# Patient Record
Sex: Female | Born: 1970 | Race: White | Hispanic: No | Smoking: Former smoker
Health system: Southern US, Community
[De-identification: ages and names within clinical notes are randomized; demographics above are authoritative.]

## PROBLEM LIST (undated history)

## (undated) DIAGNOSIS — Z86711 Personal history of pulmonary embolism: Secondary | ICD-10-CM

## (undated) DIAGNOSIS — Z8701 Personal history of pneumonia (recurrent): Secondary | ICD-10-CM

## (undated) DIAGNOSIS — I10 Essential (primary) hypertension: Secondary | ICD-10-CM

## (undated) DIAGNOSIS — K5010 Crohn's disease of large intestine without complications: Secondary | ICD-10-CM

## (undated) DIAGNOSIS — J189 Pneumonia, unspecified organism: Secondary | ICD-10-CM

## (undated) DIAGNOSIS — K219 Gastro-esophageal reflux disease without esophagitis: Secondary | ICD-10-CM

## (undated) DIAGNOSIS — G4730 Sleep apnea, unspecified: Secondary | ICD-10-CM

## (undated) DIAGNOSIS — F32A Depression, unspecified: Secondary | ICD-10-CM

## (undated) DIAGNOSIS — T4145XA Adverse effect of unspecified anesthetic, initial encounter: Secondary | ICD-10-CM

## (undated) DIAGNOSIS — K5090 Crohn's disease, unspecified, without complications: Secondary | ICD-10-CM

## (undated) DIAGNOSIS — F419 Anxiety disorder, unspecified: Secondary | ICD-10-CM

## (undated) DIAGNOSIS — J45909 Unspecified asthma, uncomplicated: Secondary | ICD-10-CM

## (undated) DIAGNOSIS — F329 Major depressive disorder, single episode, unspecified: Secondary | ICD-10-CM

## (undated) HISTORY — DX: Anxiety disorder, unspecified: F41.9

## (undated) HISTORY — DX: Essential (primary) hypertension: I10

## (undated) HISTORY — DX: Crohn's disease of large intestine without complications: K50.10

## (undated) HISTORY — DX: Personal history of pneumonia (recurrent): Z87.01

## (undated) HISTORY — PX: TUBAL LIGATION: SHX77

## (undated) HISTORY — PX: FRACTURE SURGERY: SHX138

## (undated) HISTORY — PX: ABDOMINAL HYSTERECTOMY: SHX81

## (undated) HISTORY — PX: SEPTOPLASTY: SUR1290

## (undated) HISTORY — PX: VAGINAL HYSTERECTOMY: SUR661

---

## 2004-12-28 ENCOUNTER — Emergency Department (HOSPITAL_COMMUNITY): Admission: EM | Admit: 2004-12-28 | Discharge: 2004-12-28 | Payer: Self-pay | Admitting: Emergency Medicine

## 2005-07-09 ENCOUNTER — Emergency Department (HOSPITAL_COMMUNITY): Admission: EM | Admit: 2005-07-09 | Discharge: 2005-07-09 | Payer: Self-pay | Admitting: Family Medicine

## 2006-09-11 ENCOUNTER — Emergency Department (HOSPITAL_COMMUNITY): Admission: EM | Admit: 2006-09-11 | Discharge: 2006-09-11 | Payer: Self-pay | Admitting: Emergency Medicine

## 2006-10-21 ENCOUNTER — Encounter (HOSPITAL_COMMUNITY): Admission: RE | Admit: 2006-10-21 | Discharge: 2006-11-20 | Payer: Self-pay | Admitting: Orthopedic Surgery

## 2006-11-27 ENCOUNTER — Encounter (HOSPITAL_COMMUNITY): Admission: RE | Admit: 2006-11-27 | Discharge: 2006-12-27 | Payer: Self-pay | Admitting: Orthopedic Surgery

## 2008-07-23 ENCOUNTER — Emergency Department (HOSPITAL_COMMUNITY): Admission: EM | Admit: 2008-07-23 | Discharge: 2008-07-23 | Payer: Self-pay | Admitting: Emergency Medicine

## 2010-10-14 LAB — URINALYSIS, ROUTINE W REFLEX MICROSCOPIC
Leukocytes, UA: NEGATIVE
Urobilinogen, UA: 0.2 mg/dL (ref 0.0–1.0)
pH: 5.5 (ref 5.0–8.0)

## 2010-10-14 LAB — URINE MICROSCOPIC-ADD ON

## 2010-10-14 LAB — PREGNANCY, URINE: Preg Test, Ur: NEGATIVE

## 2010-11-15 NOTE — Consult Note (Signed)
NAMEJASMAIN, Catherine Costa                ACCOUNT NO.:  [id number]   MEDICAL RECORD NO.:  [id number]          PATIENT TYPE:  EMS   LOCATION:  ED                            FACILITY:  [facility]   PHYSICIAN:  J. Darreld Mclean, M.D. DATE OF BIRTH:  1971-06-17   DATE OF CONSULTATION:  DATE OF DISCHARGE:                                 CONSULTATION   REASON FOR CONSULTATION:  The patient fell down some steps and injured  her left ankle.  X-rays show a fracture of the lateral malleolus and  posterior malleolus, essentially nondisplaced.  No other injuries.  The  ankle mortis is in good position and alignment.  There is no head  trauma.  It is a closed injury.  She has swelling and tenderness of the  left ankle and some early ecchymosis.  Range of motion is decreased.   IMPRESSION:  Fracture of the left lateral malleolus and left posterior  malleolus, nondisplaced, closed.   PLAN:  I have put her in a posterior splint.  Recommended crutches, ice,  elevation.  Prescription of Tylox given for pain.  Patient information  booklet given.  I will see her in the office on Thursday.  I will be out  of town Monday, Tuesday and Wednesday.  Dr. Romeo Apple will be covering.  If she has any difficulty come back to the emergency room or contact his  office next week.  I will see her on Thursday.  She should have no  weightbearing, as stated.  Use ice and elevation.           ______________________________  Shela Commons. Darreld Mclean, M.D.     JWK/MEDQ  D:  09/11/2006  T:  09/13/2006  Job:  [id number]

## 2012-02-05 ENCOUNTER — Emergency Department (HOSPITAL_COMMUNITY)
Admission: EM | Admit: 2012-02-05 | Discharge: 2012-02-06 | Disposition: A | Discharge status: Other Acute Inpatient Hospital | Payer: Medicaid Other | Attending: Emergency Medicine | Admitting: Emergency Medicine

## 2012-02-05 ENCOUNTER — Emergency Department (HOSPITAL_COMMUNITY): Payer: Medicaid Other

## 2012-02-05 ENCOUNTER — Encounter (HOSPITAL_COMMUNITY): Payer: Self-pay | Admitting: *Deleted

## 2012-02-05 DIAGNOSIS — R059 Cough, unspecified: Secondary | ICD-10-CM | POA: Insufficient documentation

## 2012-02-05 DIAGNOSIS — F329 Major depressive disorder, single episode, unspecified: Secondary | ICD-10-CM | POA: Insufficient documentation

## 2012-02-05 DIAGNOSIS — J189 Pneumonia, unspecified organism: Secondary | ICD-10-CM

## 2012-02-05 DIAGNOSIS — F3289 Other specified depressive episodes: Secondary | ICD-10-CM | POA: Insufficient documentation

## 2012-02-05 DIAGNOSIS — F32A Depression, unspecified: Secondary | ICD-10-CM

## 2012-02-05 DIAGNOSIS — R45851 Suicidal ideations: Secondary | ICD-10-CM

## 2012-02-05 DIAGNOSIS — F17200 Nicotine dependence, unspecified, uncomplicated: Secondary | ICD-10-CM | POA: Insufficient documentation

## 2012-02-05 DIAGNOSIS — R05 Cough: Secondary | ICD-10-CM | POA: Insufficient documentation

## 2012-02-05 LAB — CBC WITH DIFFERENTIAL/PLATELET
Basophils Absolute: 0 10*3/uL (ref 0.0–0.1)
Eosinophils Absolute: 0 10*3/uL (ref 0.0–0.7)
Hemoglobin: 14.2 g/dL (ref 12.0–15.0)
MCH: 31.2 pg (ref 26.0–34.0)
MCHC: 34.5 g/dL (ref 30.0–36.0)
MCV: 90.3 fL (ref 78.0–100.0)
Monocytes Absolute: 0.4 10*3/uL (ref 0.1–1.0)
Neutrophils Relative %: 68 % (ref 43–77)
Platelets: 160 10*3/uL (ref 150–400)
RDW: 12.9 % (ref 11.5–15.5)
WBC: 4.5 10*3/uL (ref 4.0–10.5)

## 2012-02-05 LAB — COMPREHENSIVE METABOLIC PANEL
ALT: 10 U/L (ref 0–35)
Alkaline Phosphatase: 74 U/L (ref 39–117)
BUN: 8 mg/dL (ref 6–23)
CO2: 25 mEq/L (ref 19–32)
Creatinine, Ser: 0.82 mg/dL (ref 0.50–1.10)
Sodium: 135 mEq/L (ref 135–145)
Total Bilirubin: 0.3 mg/dL (ref 0.3–1.2)

## 2012-02-05 LAB — RAPID URINE DRUG SCREEN, HOSP PERFORMED
Amphetamines: NOT DETECTED
Barbiturates: NOT DETECTED
Benzodiazepines: NOT DETECTED
Cocaine: NOT DETECTED
Opiates: NOT DETECTED
Tetrahydrocannabinol: POSITIVE — AB

## 2012-02-05 LAB — URINALYSIS, ROUTINE W REFLEX MICROSCOPIC
Bilirubin Urine: NEGATIVE
Nitrite: NEGATIVE
Urobilinogen, UA: 0.2 mg/dL (ref 0.0–1.0)
pH: 5.5 (ref 5.0–8.0)

## 2012-02-05 LAB — URINE MICROSCOPIC-ADD ON

## 2012-02-05 MED ORDER — LORAZEPAM 1 MG PO TABS
1.0000 mg | ORAL_TABLET | Freq: Three times a day (TID) | ORAL | Status: DC | PRN
  Administered 2012-02-06: 1 mg via ORAL
  Filled 2012-02-05: qty 1

## 2012-02-05 MED ORDER — GUAIFENESIN-CODEINE 100-10 MG/5ML PO SOLN
5.0000 mL | Freq: Once | ORAL | Status: AC
  Administered 2012-02-05: 5 mL via ORAL
  Filled 2012-02-05: qty 5

## 2012-02-05 MED ORDER — NICOTINE 21 MG/24HR TD PT24
21.0000 mg | MEDICATED_PATCH | Freq: Every day | TRANSDERMAL | Status: DC
  Administered 2012-02-05 – 2012-02-06 (×2): 21 mg via TRANSDERMAL
  Filled 2012-02-05: qty 1

## 2012-02-05 MED ORDER — CITALOPRAM HYDROBROMIDE 20 MG PO TABS
ORAL_TABLET | ORAL | Status: AC
  Filled 2012-02-05: qty 1

## 2012-02-05 MED ORDER — ZOLPIDEM TARTRATE 5 MG PO TABS
5.0000 mg | ORAL_TABLET | Freq: Every evening | ORAL | Status: DC | PRN
  Administered 2012-02-06: 5 mg via ORAL
  Filled 2012-02-05: qty 1

## 2012-02-05 MED ORDER — IPRATROPIUM BROMIDE 0.02 % IN SOLN
0.5000 mg | Freq: Once | RESPIRATORY_TRACT | Status: AC
  Administered 2012-02-05: 0.5 mg via RESPIRATORY_TRACT
  Filled 2012-02-05: qty 2.5

## 2012-02-05 MED ORDER — CITALOPRAM HYDROBROMIDE 20 MG PO TABS
20.0000 mg | ORAL_TABLET | Freq: Every day | ORAL | Status: DC
  Administered 2012-02-05: 20 mg via ORAL
  Filled 2012-02-05 (×3): qty 1

## 2012-02-05 MED ORDER — NICOTINE 21 MG/24HR TD PT24
MEDICATED_PATCH | TRANSDERMAL | Status: AC
  Filled 2012-02-05: qty 1

## 2012-02-05 MED ORDER — ACETAMINOPHEN 325 MG PO TABS
650.0000 mg | ORAL_TABLET | ORAL | Status: DC | PRN
  Administered 2012-02-06: 650 mg via ORAL
  Filled 2012-02-05: qty 2

## 2012-02-05 MED ORDER — ALBUTEROL SULFATE (5 MG/ML) 0.5% IN NEBU
5.0000 mg | INHALATION_SOLUTION | Freq: Once | RESPIRATORY_TRACT | Status: AC
  Administered 2012-02-05: 5 mg via RESPIRATORY_TRACT
  Filled 2012-02-05: qty 1

## 2012-02-05 MED ORDER — ONDANSETRON HCL 4 MG PO TABS: 4.0000 mg | ORAL_TABLET | Freq: Three times a day (TID) | ORAL | Status: DC | PRN

## 2012-02-05 MED ORDER — LEVOFLOXACIN 500 MG PO TABS
750.0000 mg | ORAL_TABLET | Freq: Every day | ORAL | Status: DC
  Administered 2012-02-05 – 2012-02-06 (×2): 750 mg via ORAL
  Filled 2012-02-05 (×2): qty 1

## 2012-02-05 MED ORDER — ALBUTEROL SULFATE HFA 108 (90 BASE) MCG/ACT IN AERS
2.0000 | INHALATION_SPRAY | Freq: Four times a day (QID) | RESPIRATORY_TRACT | Status: DC | PRN
  Administered 2012-02-05 – 2012-02-06 (×2): 2 via RESPIRATORY_TRACT
  Filled 2012-02-05: qty 6.7

## 2012-02-05 MED ORDER — PANTOPRAZOLE SODIUM 40 MG PO TBEC
40.0000 mg | DELAYED_RELEASE_TABLET | Freq: Every day | ORAL | Status: DC
  Administered 2012-02-06: 40 mg via ORAL
  Filled 2012-02-05: qty 1

## 2012-02-05 MED ORDER — ACETAMINOPHEN 325 MG PO TABS
650.0000 mg | ORAL_TABLET | Freq: Once | ORAL | Status: AC
  Administered 2012-02-05: 650 mg via ORAL
  Filled 2012-02-05: qty 2

## 2012-02-05 NOTE — BH Assessment (Signed)
Assessment Note   Catherine Costa is an 41 y.o. female. PR REPORTED TO THE ED STATING SHE DOES NOT WANT TO LIVE.  OPT REPORTS FOR THE PAST 2-3 DAYS SHE HAS FELT SUICIDAL . SHE HAD BEE LIVING IN THE HOME WITH HER DAUGHTER AND HER 14 MO OLD DAUGHTER AND HER DAUGHTER'S BOYFRIEND WHO HAS SEVERAL GUNS IN THE HOME. 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  PT ALSO REPORTS RECENT SEPARATION FROM HER HUSBAND WHO WENT TO JAIL FOR DRIVING HER CAR WITHOUT A LICENCE AND WRECKING IT AND NON-SUPPORT. SHE REPORTS HAVING A 60 YEAR OLD SON WHO IS ON DRUGS AND SHE IS UNABLE TO HANDLE HIM. HER 38 YEAR OLD DAUGHTER HAS THYROID CANCER AND REFUSES TO SEE HER.  PT REPORTS SHE HAS A KNOT IN HER BREST AND CAN'T AFFORD TO HAVE IT CHECKED OUT. SHE REPORTS NOT BEING ABLE TO TAKE IT ANYMORE AND JUST WANTS TO DIE. PT REPORTS RECENT FEVER, CONGESTION, HEADACHE AND RUNNING NOSE.  PT GOT A CHEST X-RAY.     Axis I: Depressive Disorder NOS Axis II: Deferred Axis III: History reviewed. No pertinent past medical history. Axis IV: economic problems, housing problems, occupational problems, other psychosocial or environmental problems, problems related to social environment and problems with primary support group Axis V: 11-20 some danger of hurting self or others possible OR occasionally fails to maintain minimal personal hygiene OR gross impairment in communication         Past Medical History: History reviewed. No pertinent past medical history.  Past Surgical History  Procedure Date  . Abdominal hysterectomy     Family History: History reviewed. No pertinent family history.  Social History:  reports that she has been smoking.  She does not have any smokeless tobacco history on file. She reports that she uses illicit drugs (Marijuana). Her alcohol history not on file.  Additional Social History:  Alcohol / Drug  Use Pain Medications: NA Prescriptions: NA Over the Counter: NA History of alcohol / drug use?: Yes Substance #1 Name of Substance 1: MARIJUANA 1 - Age of First Use: 11 1 - Amount (size/oz): JOINT 1 - Frequency: 1 TIME IN EIGHT YEARS 1 - Duration: THIS WEEK END 1 - Last Use / Amount: THIS WEEK END  CIWA: CIWA-Ar BP: 138/102 mmHg Pulse Rate: 115  COWS:    Allergies:  Allergies  Allergen Reactions  . Sulfa Antibiotics   . Voltaren (Diclofenac Sodium)     Home Medications:  (Not in a hospital admission)  OB/GYN Status:  No LMP recorded. Patient has had a hysterectomy.  General Assessment Data Location of Assessment: [facility] ACT Assessment: Yes Living Arrangements: Other (Comment) (homeless) Can pt return to current living arrangement?: No Admission Status: Voluntary Is patient capable of signing voluntary admission?: Yes Transfer from: [facility]) Referral Source: MD (DR Raeford Razor)  Education Status Contact person: [place] PYRTLE-DAUGHTER-[phone]  Risk to self Suicidal Ideation: Yes-Currently Present Suicidal Intent: Yes-Currently Present Is patient at risk for suicide?: Yes Suicidal Plan?: Yes-Currently Present Specify Current Suicidal Plan: TO SHOOT SELF Access to Means: Yes (CAN GET GUN ) Specify Access to Suicidal Means: DAUGHTER'S BOYFRIEND HAS SEVERAL GUNS IN THE HOME What has been your use of drugs/alcohol within the last 12 months?: MARIJUANA Previous Attempts/Gestures: Yes How many times?: 1  (UNREPORTED OVERDOSE 1 YR AGO) Other  Self Harm Risks: NO Triggers for Past Attempts: Family contact;Spouse contact;Other (Comment) (OTHER FAMILY PROBLEMS) Intentional Self Injurious Behavior: None Family Suicide History: No Recent stressful life event(s): Other (Comment) (SEPERATION) Persecutory voices/beliefs?: No Depression: Yes Depression Symptoms: Despondent;Insomnia;Tearfulness;Isolating;Fatigue;Loss of interest in usual  pleasures;Feeling worthless/self pity;Feeling angry/irritable Substance abuse history and/or treatment for substance abuse?: Yes Suicide prevention information given to non-admitted patients: Not applicable  Risk to Others Homicidal Ideation: No Thoughts of Harm to Others: No Current Homicidal Intent: No Current Homicidal Plan: No Access to Homicidal Means: No History of harm to others?: No Assessment of Violence: None Noted Does patient have access to weapons?: Yes (Comment) (CAN GET A GUN) Criminal Charges Pending?: No Does patient have a court date: No  Psychosis Hallucinations: None noted Delusions: None noted  Mental Status Report Appear/Hygiene: Improved Eye Contact: Fair Motor Activity: Freedom of movement;Restlessness Speech: Logical/coherent;Pressured;Soft Level of Consciousness: Alert;Crying Mood: Depressed;Despair;Empty;Helpless;Sad;Worthless, low self-esteem Affect: Appropriate to circumstance;Depressed;Frightened;Sad Anxiety Level: Moderate Thought Processes: Coherent;Relevant Judgement: Impaired Orientation: Person;Place;Time;Situation Obsessive Compulsive Thoughts/Behaviors: None  Cognitive Functioning Concentration: Normal Memory: Recent Intact;Remote Intact IQ: Average Insight: Poor Impulse Control: Poor Appetite: Poor Sleep: Decreased Total Hours of Sleep: 3  Vegetative Symptoms: None  ADLScreening [facility] Assessment Services) Patient's cognitive ability adequate to safely complete daily activities?: Yes Patient able to express need for assistance with ADLs?: Yes Independently performs ADLs?: Yes  Abuse/Neglect [facility]) Physical Abuse: Denies Verbal Abuse: Denies Sexual Abuse: Denies  Prior Inpatient Therapy Prior Inpatient Therapy: No  Prior Outpatient Therapy Prior Outpatient Therapy: No  ADL Screening (condition at time of admission) Patient's cognitive ability adequate to safely complete daily activities?: Yes Patient able to express need  for assistance with ADLs?: Yes Independently performs ADLs?: Yes Weakness of Legs: None Weakness of Arms/Hands: None  Home Assistive Devices/Equipment Home Assistive Devices/Equipment: None  Therapy Consults (therapy consults require a physician order) PT Evaluation Needed: No OT Evalulation Needed: No SLP Evaluation Needed: No Abuse/Neglect Assessment (Assessment to be complete while patient is alone) Physical Abuse: Denies Verbal Abuse: Denies Sexual Abuse: Denies Exploitation of patient/patient's resources: Denies Self-Neglect: Denies Values / Beliefs Cultural Requests During Hospitalization: None Spiritual Requests During Hospitalization: None Consults Spiritual Care Consult Needed: No Social Work Consult Needed: No Merchant navy officer (For Healthcare) Advance Directive: Patient does not have advance directive;Patient would not like information Pre-existing out of facility DNR order (yellow form or pink MOST form): No    Additional Information 1:1 In Past 12 Months?: No CIRT Risk: No Elopement Risk: No Does patient have medical clearance?: Yes     Disposition: REFERRED TO [facility] Disposition Disposition of Patient: Inpatient treatment program Type of inpatient treatment program: Adult  On Site Evaluation by:   Reviewed with Physician:     Hattie Perch Winford 02/05/2012 8:59 PM

## 2012-02-05 NOTE — BHH Counselor (Signed)
Catherine Costa, assessment counselor at [facility], submitted Pt for admission to [facility]. [name], [facility] confirmed there are no adult beds currently available. Dr. Orson Aloe reviewed clinical information and accepted Pt to [facility] when an appropriate bed is available. Notified ED staff at [facility] of disposition.  Harlin Rain Patsy [place], LPC

## 2012-02-05 NOTE — ED Notes (Signed)
"  I just want to die" crying

## 2012-02-05 NOTE — ED Notes (Signed)
Pt states a slight increase in SOB. Some expiratory wheezing is noted. Resp called and his giving her albuterol inhaler 2 puff.

## 2012-02-05 NOTE — ED Notes (Signed)
Alice called, stated patient has been accepted at [facility] and that she will have a bed in the morning as soon as the first patent is discharged.

## 2012-02-05 NOTE — ED Notes (Addendum)
Pt states "I have had so much going on lately" and that "I dont want to live anymore" states she has had a gun to her head 2 times prior to coming here. States she has SI but does not have a plan while in the hospital. Says her plan is to shoot herself if she is at home or gets out. States however that she does want our help. Pt crying while talking to me and appears visibly upset .

## 2012-02-05 NOTE — ED Provider Notes (Signed)
History  This chart was scribed for Raeford Razor, MD by Bennett Scrape. This patient was seen in room [facility] and the patient's care was started at 7:25PM.  CSN: [id number]  Arrival date & time 02/05/12  1823   First MD Initiated Contact with Patient 02/05/12 1925      Chief Complaint  Patient presents with  . Medical Clearance    The history is provided by the patient. No language interpreter was used.    Catherine Costa is a 41 y.o. female who presents to the Emergency Department complaining of 2 to 3 days of non-changing, constant SI. Pt states that she plans to shoot herself with a gun due to increased stress at home. She reports that her mother has CA, her 47 year old son is out of control, her 28 year old daughter treats her like a "permanent babysitter" and she is constantly fighting with her boyfriend. She denies HI. She also c/o non-productive cough with associated occasional CP and SOB. She has a fever of 100.1 in the ED but she denies having a fever at home. She denies chills, nausea, emesis, abdominal pain and HA as associated symptoms.  She does not have a h/o chronic medical conditions. She reports that she uses marijuana occasionally but denies any illegal drug use today. She is a current everyday smoker.  PCP is Dr. Neita Carp.  History reviewed. No pertinent past medical history.  Past Surgical History  Procedure Date  . Abdominal hysterectomy     History reviewed. No pertinent family history.  History  Substance Use Topics  . Smoking status: Current Everyday Smoker  . Smokeless tobacco: Not on file  . Alcohol Use:     No OB history provided.  Review of Systems  Constitutional: Negative for fever and chills.  Respiratory: Positive for cough and shortness of breath.   Cardiovascular: Positive for chest pain.  Gastrointestinal: Negative for nausea, vomiting, abdominal pain and diarrhea.  Psychiatric/Behavioral: Positive for suicidal ideas.  All other  systems reviewed and are negative.    Allergies  Sulfa antibiotics and Voltaren  Home Medications  No current outpatient prescriptions on file.  Triage Vitals: BP 138/102  Pulse 115  Temp 100.1 F (37.8 C) (Oral)  Resp 22  Ht 5\' 6"  (1.676 m)  Wt 170 lb (77.111 kg)  BMI 27.44 kg/m2  SpO2 97%  Physical Exam  Nursing note and vitals reviewed. Constitutional: She is oriented to person, place, and time. She appears well-developed and well-nourished. No distress.  HENT:  Head: Normocephalic and atraumatic.  Eyes: Conjunctivae and EOM are normal.  Neck: Neck supple. No tracheal deviation present.  Cardiovascular: Regular rhythm and normal heart sounds.        Mildly tachycardic  Pulmonary/Chest: Effort normal. No respiratory distress. She has wheezes (difuse).       Wet sounding cough  Abdominal: Soft. There is no tenderness.  Musculoskeletal: Normal range of motion. She exhibits no edema.  Neurological: She is alert and oriented to person, place, and time.  Skin: Skin is warm and dry.  Psychiatric:       Emotionally labile, crying at times    ED Course  Procedures (including critical care time)  DIAGNOSTIC STUDIES: Oxygen Saturation is 97% on room air, adequate by my interpretation.    COORDINATION OF CARE: 7:37PM-Discussed treatment plan which includes CBC panel, drug screen and urinalysis with pt at bedside and pt agreed to plan. Once medically cleared, pt will be evaluated by ACT after being  medically cleared.   Labs Reviewed  URINE RAPID DRUG SCREEN (HOSP PERFORMED) - Abnormal; Notable for the following:    Tetrahydrocannabinol POSITIVE (*)     All other components within normal limits  COMPREHENSIVE METABOLIC PANEL - Abnormal; Notable for the following:    Potassium 3.4 (*)     Glucose, Bld 103 (*)     GFR calc non Af Amer 87 (*)     All other components within normal limits  URINALYSIS, ROUTINE W REFLEX MICROSCOPIC - Abnormal; Notable for the following:     Color, Urine AMBER (*)  BIOCHEMICALS MAY BE AFFECTED BY COLOR   Specific Gravity, Urine >1.030 (*)     Hgb urine dipstick MODERATE (*)     Ketones, ur TRACE (*)     Protein, ur TRACE (*)     All other components within normal limits  URINE MICROSCOPIC-ADD ON - Abnormal; Notable for the following:    Squamous Epithelial / LPF FEW (*)     Bacteria, UA FEW (*)     All other components within normal limits  CBC WITH DIFFERENTIAL  ETHANOL   Dg Chest 2 View  02/05/2012  *RADIOLOGY REPORT*  Clinical Data: Cough, fever  CHEST - 2 VIEW  Comparison: 12/28/2004  Findings: Faint ill-defined increased opacity in the right upper lobe which is also present on the lateral view consistent with right upper lobe pneumonia.  Left lung clear.  Normal heart size and vascularity.  No effusion or pneumothorax.  Trachea is midline.  IMPRESSION: Patchy right upper lobe airspace process compatible with pneumonia. Recommend radiographic follow-up to document complete resolution.  Original Report Authenticated By: Judie Petit. Ruel Favors, M.D.     1. Suicidal ideation   2. Depression   3. CAP (community acquired pneumonia)       MDM  40yF with suicidal ideation with plan to shoot self and has access to firearms. Pt voluntary. Pt also with cough and fever. CXR and clinical picture consistent with pneumonia. Will tx for CAP. This is a patient that I would tx as an outpt for pneumonia and do not feel that further medical intervention is necessary given that being started on abx and do not feel should interfere with psych disposition.   I personally preformed the services scribed in my presence. The recorded information has been reviewed and considered. Raeford Razor, MD.        Raeford Razor, MD 02/08/12 [phone]

## 2012-02-06 ENCOUNTER — Encounter (HOSPITAL_COMMUNITY): Payer: Self-pay | Admitting: *Deleted

## 2012-02-06 ENCOUNTER — Inpatient Hospital Stay (HOSPITAL_COMMUNITY)
Admission: AD | Admit: 2012-02-06 | Discharge: 2012-02-09 | DRG: 885 | Disposition: A | Discharge status: Home / Self Care | Payer: 59 | Source: Ambulatory Visit | Attending: Psychiatry | Admitting: Psychiatry

## 2012-02-06 DIAGNOSIS — F329 Major depressive disorder, single episode, unspecified: Secondary | ICD-10-CM

## 2012-02-06 DIAGNOSIS — R45851 Suicidal ideations: Secondary | ICD-10-CM

## 2012-02-06 DIAGNOSIS — F1210 Cannabis abuse, uncomplicated: Secondary | ICD-10-CM | POA: Diagnosis present

## 2012-02-06 DIAGNOSIS — F332 Major depressive disorder, recurrent severe without psychotic features: Principal | ICD-10-CM | POA: Diagnosis present

## 2012-02-06 DIAGNOSIS — F489 Nonpsychotic mental disorder, unspecified: Secondary | ICD-10-CM | POA: Diagnosis present

## 2012-02-06 DIAGNOSIS — Z590 Homelessness: Secondary | ICD-10-CM

## 2012-02-06 MED ORDER — MAGNESIUM HYDROXIDE 400 MG/5ML PO SUSP: 30.0000 mL | Freq: Every day | ORAL | Status: DC | PRN

## 2012-02-06 MED ORDER — ACETAMINOPHEN 325 MG PO TABS
650.0000 mg | ORAL_TABLET | Freq: Four times a day (QID) | ORAL | Status: DC | PRN
  Administered 2012-02-07: 650 mg via ORAL

## 2012-02-06 MED ORDER — ALBUTEROL SULFATE HFA 108 (90 BASE) MCG/ACT IN AERS
2.0000 | INHALATION_SPRAY | Freq: Four times a day (QID) | RESPIRATORY_TRACT | Status: DC | PRN
  Administered 2012-02-06 – 2012-02-08 (×5): 2 via RESPIRATORY_TRACT
  Filled 2012-02-06: qty 6.7

## 2012-02-06 MED ORDER — PANTOPRAZOLE SODIUM 20 MG PO TBEC
20.0000 mg | DELAYED_RELEASE_TABLET | Freq: Two times a day (BID) | ORAL | Status: DC
  Administered 2012-02-07 – 2012-02-09 (×5): 20 mg via ORAL
  Filled 2012-02-06 (×10): qty 1

## 2012-02-06 MED ORDER — ALBUTEROL SULFATE (5 MG/ML) 0.5% IN NEBU
5.0000 mg | INHALATION_SOLUTION | Freq: Once | RESPIRATORY_TRACT | Status: AC
  Administered 2012-02-06: 5 mg via RESPIRATORY_TRACT
  Filled 2012-02-06: qty 1

## 2012-02-06 MED ORDER — ALUM & MAG HYDROXIDE-SIMETH 200-200-20 MG/5ML PO SUSP: 30.0000 mL | ORAL | Status: DC | PRN

## 2012-02-06 MED ORDER — CITALOPRAM HYDROBROMIDE 20 MG PO TABS
20.0000 mg | ORAL_TABLET | Freq: Every day | ORAL | Status: DC
  Administered 2012-02-06 – 2012-02-09 (×4): 20 mg via ORAL
  Filled 2012-02-06 (×7): qty 1

## 2012-02-06 MED ORDER — IBUPROFEN 400 MG PO TABS
600.0000 mg | ORAL_TABLET | Freq: Four times a day (QID) | ORAL | Status: DC | PRN
  Administered 2012-02-06 (×2): 600 mg via ORAL
  Filled 2012-02-06 (×2): qty 2

## 2012-02-06 MED ORDER — IBUPROFEN 800 MG PO TABS
800.0000 mg | ORAL_TABLET | Freq: Four times a day (QID) | ORAL | Status: DC | PRN
  Administered 2012-02-06 – 2012-02-09 (×6): 800 mg via ORAL
  Filled 2012-02-06 (×5): qty 1

## 2012-02-06 MED ORDER — TRAZODONE HCL 100 MG PO TABS
100.0000 mg | ORAL_TABLET | Freq: Every evening | ORAL | Status: DC | PRN
  Administered 2012-02-07 – 2012-02-08 (×3): 100 mg via ORAL
  Filled 2012-02-06 (×3): qty 1

## 2012-02-06 MED ORDER — IPRATROPIUM BROMIDE 0.02 % IN SOLN
0.5000 mg | Freq: Once | RESPIRATORY_TRACT | Status: AC
  Administered 2012-02-06: 0.5 mg via RESPIRATORY_TRACT
  Filled 2012-02-06: qty 2.5

## 2012-02-06 MED ORDER — GUAIFENESIN-CODEINE 100-10 MG/5ML PO SOLN
5.0000 mL | Freq: Once | ORAL | Status: AC
  Administered 2012-02-06: 5 mL via ORAL
  Filled 2012-02-06: qty 5

## 2012-02-06 MED ORDER — CLONAZEPAM 1 MG PO TABS
1.0000 mg | ORAL_TABLET | Freq: Two times a day (BID) | ORAL | Status: DC
  Administered 2012-02-06 – 2012-02-09 (×6): 1 mg via ORAL
  Filled 2012-02-06 (×6): qty 1

## 2012-02-06 NOTE — Progress Notes (Signed)
Patient ID: Catherine Costa, female   DOB: 04/24/1971, 41 y.o.   MRN: [id number] Tasmine is admitted to [facility] from [facility], feeling hopeless, helpless

## 2012-02-06 NOTE — BH Assessment (Signed)
Assessment Note   Catherine Costa is an 41 y.o. female.  PT CONTINUES TO BE DELUSIONAL.  SHE HAS BEEN ACCEPTED TO [facility] PENDING A 400 HALL BED. DISCUSSED WITH PATIENT.  INFORMED PT'S NURSE.  SEE UPDATED IVC PAPERS.  Axis I: Delusional Disorder Axis II: Deferred Axis III: History reviewed. No pertinent past medical history. Axis IV: economic problems, housing problems and problems with primary support group Axis V: 11-20 some danger of hurting self or others possible OR occasionally fails to maintain minimal personal hygiene OR gross impairment in communication  Past Medical History: History reviewed. No pertinent past medical history.  Past Surgical History  Procedure Date  . Abdominal hysterectomy     Family History: History reviewed. No pertinent family history.  Social History:  reports that she has been smoking.  She does not have any smokeless tobacco history on file. She reports that she uses illicit drugs (Marijuana). Her alcohol history not on file.  Additional Social History:  Alcohol / Drug Use Pain Medications: NA Prescriptions: NA Over the Counter: NA History of alcohol / drug use?: Yes Substance #1 Name of Substance 1: MARIJUANA 1 - Age of First Use: 11 1 - Amount (size/oz): JOINT 1 - Frequency: 1 TIME IN EIGHT YEARS 1 - Duration: THIS WEEK END 1 - Last Use / Amount: THIS WEEK END  CIWA: CIWA-Ar BP: 124/80 mmHg Pulse Rate: 83  COWS:    Allergies:  Allergies  Allergen Reactions  . Bee Venom Anaphylaxis  . Sulfa Antibiotics Other (See Comments)    REACTION: Affects Colon  . Voltaren (Diclofenac Sodium) Other (See Comments)    REACTION: Affects Colon    Home Medications:  (Not in a hospital admission)  OB/GYN Status:  No LMP recorded. Patient has had a hysterectomy.  General Assessment Data Location of Assessment: [facility] ACT Assessment: Yes Living Arrangements: Other (Comment) (homeless) Can pt return to current living arrangement?: No Admission  Status: Voluntary Is patient capable of signing voluntary admission?: Yes Transfer from: [facility] Hospital [facility] Referral Source: MD (DR Raeford Razor)  Education Status Contact person: [place] PYRTLE-DAUGHTER-[phone]  Risk to self Suicidal Ideation: Yes-Currently Present Suicidal Intent: Yes-Currently Present Is patient at risk for suicide?: Yes Suicidal Plan?: Yes-Currently Present Specify Current Suicidal Plan: TO SHOOT SELF Access to Means: Yes (CAN GET GUN ) Specify Access to Suicidal Means: DAUGHTER'S BOYFRIEND HAS SEVERAL GUNS IN THE HOME What has been your use of drugs/alcohol within the last 12 months?: MARIJUANA Previous Attempts/Gestures: Yes How many times?: 1  (UNREPORTED OVERDOSE 1 YR AGO) Other Self Harm Risks: NO Triggers for Past Attempts: Family contact;Spouse contact;Other (Comment) (OTHER FAMILY PROBLEMS) Intentional Self Injurious Behavior: None Family Suicide History: No Recent stressful life event(s): Other (Comment) (SEPERATION) Persecutory voices/beliefs?: No Depression: Yes Depression Symptoms: Despondent;Insomnia;Tearfulness;Isolating;Fatigue;Loss of interest in usual pleasures;Feeling worthless/self pity;Feeling angry/irritable Substance abuse history and/or treatment for substance abuse?: Yes Suicide prevention information given to non-admitted patients: Not applicable  Risk to Others Homicidal Ideation: No Thoughts of Harm to Others: No Current Homicidal Intent: No Current Homicidal Plan: No Access to Homicidal Means: No History of harm to others?: No Assessment of Violence: None Noted Does patient have access to weapons?: Yes (Comment) (CAN GET A GUN) Criminal Charges Pending?: No Does patient have a court date: No  Psychosis Hallucinations: None noted Delusions: None noted  Mental Status Report Appear/Hygiene: Improved Eye Contact: Fair Motor Activity: Freedom of movement;Restlessness Speech:  Logical/coherent;Pressured;Soft Level of Consciousness: Alert;Crying Mood: Depressed;Despair;Empty;Helpless;Sad;Worthless, low self-esteem  Affect: Appropriate to circumstance;Depressed;Frightened;Sad Anxiety Level: Moderate Thought Processes: Coherent;Relevant Judgement: Impaired Orientation: Person;Place;Time;Situation Obsessive Compulsive Thoughts/Behaviors: None  Cognitive Functioning Concentration: Normal Memory: Recent Intact;Remote Intact IQ: Average Insight: Poor Impulse Control: Poor Appetite: Poor Sleep: Decreased Total Hours of Sleep: 3  Vegetative Symptoms: None  ADLScreening [facility] Assessment Services) Patient's cognitive ability adequate to safely complete daily activities?: Yes Patient able to express need for assistance with ADLs?: Yes Independently performs ADLs?: Yes  Abuse/Neglect [facility]) Physical Abuse: Denies Verbal Abuse: Denies Sexual Abuse: Denies  Prior Inpatient Therapy Prior Inpatient Therapy: No  Prior Outpatient Therapy Prior Outpatient Therapy: No  ADL Screening (condition at time of admission) Patient's cognitive ability adequate to safely complete daily activities?: Yes Patient able to express need for assistance with ADLs?: Yes Independently performs ADLs?: Yes Weakness of Legs: None Weakness of Arms/Hands: None  Home Assistive Devices/Equipment Home Assistive Devices/Equipment: None  Therapy Consults (therapy consults require a physician order) PT Evaluation Needed: No OT Evalulation Needed: No SLP Evaluation Needed: No Abuse/Neglect Assessment (Assessment to be complete while patient is alone) Physical Abuse: Denies Verbal Abuse: Denies Sexual Abuse: Denies Exploitation of patient/patient's resources: Denies Self-Neglect: Denies Values / Beliefs Cultural Requests During Hospitalization: None Spiritual Requests During Hospitalization: None Consults Spiritual Care Consult Needed: No Social Work Consult Needed: No Dispensing optician (For Healthcare) Advance Directive: Patient does not have advance directive;Patient would not like information Pre-existing out of facility DNR order (yellow form or pink MOST form): No Nutrition Screen Diet: Other (Comment) (Ginger Ale)  Additional Information 1:1 In Past 12 Months?: No CIRT Risk: No Elopement Risk: No Does patient have medical clearance?: Yes     Disposition: PT ACCEPTED AT [facility] WAITING FOR A 400 HALL BED. Disposition Disposition of Patient: Inpatient treatment program Type of inpatient treatment program: Adult (ACCEPTED [facility] PENDING 400 HALL BED.)  On Site Evaluation by:   Reviewed with Physician:  DR Raoul Pitch Winford 02/06/2012 4:16 PM

## 2012-02-06 NOTE — ED Notes (Signed)
Pt states trouble sleeping, prn ambien given

## 2012-02-06 NOTE — ED Notes (Signed)
Pt complaining of headache and some mild SOB. Pt given tylenol 650mg  and albuterol 2 puffs per PRN orders.

## 2012-02-06 NOTE — Progress Notes (Signed)
Psychoeducational Group Note  Date:  02/06/2012 Time:  2000  Group Topic/Focus:  Wrap-Up Group:   The focus of this group is to help patients review their daily goal of treatment and discuss progress on daily workbooks.  Participation Level:  Minimal  Participation Quality:  Attentive  Affect:  Flat  Cognitive:  Appropriate  Insight:  Limited  Engagement in Group:  Limited  Additional Comments:  Pt stated that she is feeling overwhelmed, but she is glad to be here.  Kaleen Odea R 02/06/2012, 9:31 PM

## 2012-02-06 NOTE — ED Notes (Signed)
Pt c/o increased sob after using inhaler and being "hot."  Fan provided for pt comfort.  Edp notified and orders received.  Vitals wnl.  Sitter at bedside.

## 2012-02-06 NOTE — Progress Notes (Signed)
Patient ID: Catherine Costa, female   DOB: 09/17/1970, 41 y.o.   MRN: [id number]      Nursing Admit Note This is the first admission for this 41 year old woman who comes to [facility] feelings hopeless and helpless, but willing to contract for safety. She reports " overwhelming" life stressors..She  Shares that her adult daughter has just been diagnosed with thyroid cancer , her exhusband ( who has her 48 year old son) has just gotten ticket for driving without a license and  She is currently being treated for pneumonia. SHe reports she is allergic to bee venom, voltaren and sulfa. All of which cause her anaphylaxsis if taken. She shares she has a PMH of migraine h/a, colitis, asthma and s/p broken left ankle in 08/2006. After admission is completed, she is oriented to unit and taken to her room per protocol. PD RN [facility]

## 2012-02-06 NOTE — ED Notes (Signed)
Spoke with Catherine Costa at [facility] - states they are at capacity today with no discharges.

## 2012-02-06 NOTE — ED Notes (Signed)
Pt requesting meds for back pain.  meds given.  Pt tearful after talking to husband.  Comfort measures given.  Visiting with daughter at this time.  nad noted.  Sitter at bedside.

## 2012-02-06 NOTE — ED Notes (Signed)
Per Catherine Costa with [facility] - pt is next on waiting list for bed on [address] at [facility] - waiting for discharge - and will be transferred today.

## 2012-02-06 NOTE — ED Notes (Signed)
Pt has been accepted to [facility] bed 501-02 by Dr. Dan Humphreys.

## 2012-02-06 NOTE — ED Notes (Signed)
Pt requesting something for anxiety.  meds given.

## 2012-02-06 NOTE — ED Notes (Signed)
Patient states she has a headache and wants a breathing treatment. RN aware.

## 2012-02-07 DIAGNOSIS — F3289 Other specified depressive episodes: Secondary | ICD-10-CM

## 2012-02-07 DIAGNOSIS — Z590 Homelessness: Secondary | ICD-10-CM

## 2012-02-07 DIAGNOSIS — R45851 Suicidal ideations: Secondary | ICD-10-CM

## 2012-02-07 DIAGNOSIS — F329 Major depressive disorder, single episode, unspecified: Secondary | ICD-10-CM | POA: Diagnosis present

## 2012-02-07 MED ORDER — GUAIFENESIN ER 600 MG PO TB12
ORAL_TABLET | ORAL | Status: AC
  Filled 2012-02-07: qty 1

## 2012-02-07 MED ORDER — GUAIFENESIN ER 600 MG PO TB12
600.0000 mg | ORAL_TABLET | Freq: Two times a day (BID) | ORAL | Status: DC
  Administered 2012-02-07 – 2012-02-09 (×6): 600 mg via ORAL
  Filled 2012-02-07 (×10): qty 1

## 2012-02-07 MED ORDER — BENZONATATE 100 MG PO CAPS
100.0000 mg | ORAL_CAPSULE | Freq: Three times a day (TID) | ORAL | Status: DC
  Administered 2012-02-07 – 2012-02-09 (×7): 100 mg via ORAL
  Filled 2012-02-07 (×13): qty 1

## 2012-02-07 MED ORDER — LEVOFLOXACIN 750 MG PO TABS
750.0000 mg | ORAL_TABLET | Freq: Every day | ORAL | Status: DC
  Administered 2012-02-07 – 2012-02-09 (×3): 750 mg via ORAL
  Filled 2012-02-07 (×5): qty 1

## 2012-02-07 MED ORDER — GUAIFENESIN ER 600 MG PO TB12
600.0000 mg | ORAL_TABLET | Freq: Two times a day (BID) | ORAL | Status: DC | PRN
  Administered 2012-02-07 – 2012-02-08 (×3): 600 mg via ORAL
  Filled 2012-02-07: qty 1

## 2012-02-07 MED ORDER — NICOTINE 14 MG/24HR TD PT24
MEDICATED_PATCH | TRANSDERMAL | Status: AC
  Administered 2012-02-07: 11:00:00
  Filled 2012-02-07: qty 1

## 2012-02-07 NOTE — [facility]HH Suicide Risk Assessment (Signed)
Suicide Risk Assessment  Admission Assessment     Demographic factors:  Assessment Details Time of Assessment: Admission Information Obtained From: Patient Current Mental Status:  Current Mental Status: Belief that plan would result in death Loss Factors:   unemployed Historical Factors:  Historical Factors: Prior suicide attempts Risk Reduction Factors:  Risk Reduction Factors: Responsible for children under 43 years of age;Sense of responsibility to family;Religious beliefs about death;Living with another person, especially a relative;Positive social support  CLINICAL FACTORS:   Depression:   Hopelessness  COGNITIVE FEATURES THAT CONTRIBUTE TO RISK:  Loss of executive function    SUICIDE RISK:   Moderate:  Frequent suicidal ideation with limited intensity, and duration, some specificity in terms of plans, no associated intent, good self-control, limited dysphoria/symptomatology, some risk factors present, and identifiable protective factors, including available and accessible social support.  PLAN OF CARE:  Mental Status Examination/Evaluation:  Objective: Appearance: Fairly Groomed   Psychomotor Activity: Somewhat restless due to cough causing respiratory distress   Eye Contact: Good   Speech: Clear and Coherent and Normal Rate   Volume: Normal   Mood:no good  Affect: ristricted  Thought Process: clear rational goal oriented   Orientation: Full   Thought Content: No AVH/psychosis   Suicidal Thoughts: No   Homicidal Thoughts: No   Judgement: poor   Insight:  poor  DIAGNOSIS:  AXIS I  Depressive Disorder nos, cannabis abuse  AXIS II  Deferred   AXIS III  See medical history.  Needs a mammogram hasn't had first yet   AXIS IV  economic problems, educational problems, housing problems, occupational problems, other psychosocial or environmental problems and problems with primary support group   AXIS V  35   Treatment Plan Summary:  Admit for safety & stabilization  Treat  Community Acquired Pneumonia  Adjust meds as indicated  Counselor to explore community supports   Wonda Cerise 02/07/2012, 4:44 PM

## 2012-02-07 NOTE — Progress Notes (Signed)
Psychoeducational Group Note  Date:  02/07/2012 Time:  1515  Group Topic/Focus:  Goals Group:   The focus of this group is to help patients establish daily goals to achieve during treatment and discuss how the patient can incorporate goal setting into their daily lives to aide in recovery.  Participation Level:  Active  Participation Quality:  Appropriate  Affect:  Anxious  Cognitive:  Alert  Insight:  Good  Engagement in Group:  Good  Additional Comments:  good  Graceann Congress Celcia 02/07/2012, 6:37 PM
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
## 2012-02-07 NOTE — Progress Notes (Signed)
BHH Group Notes:  (Counselor/Nursing/MHT/Case Management/Adjunct)  02/07/2012 9:52 PM  Type of Therapy:  Psychoeducational Skills  Participation Level:  Active  Participation Quality:  Appropriate  Affect:  Appropriate  Cognitive:  Appropriate  Insight:  Good  Engagement in Group:  Good  Engagement in Therapy:  Good  Modes of Intervention:  Education  Summary of Progress/Problems:Pt. States that she had a good day today as she laughed a great deal. She verbalizes that she laughed at her own issues and was proud that she spoke up in her groups earlier in the day. Her goal for tomorrow is to get better.   Hazle Coca S 02/07/2012, 9:52 PM

## 2012-02-07 NOTE — Progress Notes (Signed)
Patient seen by writer at medication window. Patient reports pain in her chest when she breathes deeply, related to her pneumonia. Patient informed that it was too soon for her ibuprofen and was offered tylenol which she received. Patient offered support and encouragement, was informed of her next available doses of ibuprofen, mucinex prn and inhaler. Patient was appreciative of information. Patient currently denies si/hi/a/v hall. Safety maintained on unit, will continue to monitor.

## 2012-02-07 NOTE — Progress Notes (Signed)
[facility] Adult Inpatient Family/Significant Other Suicide Prevention Education  Suicide Prevention Education:  Education Completed;  Catherine [phone]-Pt.'s husband- has been identified by the patient as the family member/significant other with whom the patient will be residing, and identified as the person(s) who will aid the patient in the event of a mental health crisis (suicidal ideations/suicide attempt).  With written consent from the patient, the family member/significant other has been provided the following suicide prevention education, prior to the and/or following the discharge of the patient.  The suicide prevention education provided includes the following:  Suicide risk factors  Suicide prevention and interventions  National Suicide Hotline telephone number  [facility] assessment telephone number  [facility] Emergency Assistance 911  [facility] and/or Residential Mobile Crisis Unit telephone number  Request made of family/significant other to:  Remove weapons (e.g., guns, rifles, knives), all items previously/currently identified as safety concern.  Pt.'s  Husband states the pt. Doe snot have access to guns or weapons  Remove drugs/medications (over-the-counter, prescriptions, illicit drugs), all items previously/currently identified as a safety concern. Pt.'s husband had no concerns/ will secure home.  Pt.'s husband stated that the pt. Has been under a lot of stress with her daughter, grandchildren and states the pt. has been wanting to move out of [place]  And that is why he is in [place] Looking for a place for them to live. Pt.'s husband was asking about when the pt.. Would be d/c. He was told that d/c date would be up to the doctor and if /when he felt the pt. Was ready for d/c. Husband was told a typical stay at [facility] is 3-5 days or longer if the doctor feels it is needed.  The family member/significant other verbalizes understanding  of the suicide prevention education information provided.  The family member/significant other agrees to remove the items of safety concern listed above.  Lamar Blinks [facility] 02/07/2012, 5:34 PM

## 2012-02-07 NOTE — Progress Notes (Signed)
Patient ID: Catherine Costa, female   DOB: Feb 02, 1971, 40 y.o.   MRN: [id number]  Problem: Anxiety, Depression  D: Pt withdrawn and quiet but interacting appropriately with peers.  A: Monitor patient Q 15 minutes for safety, encourage staff/peer interaction and group participation, administer medications as ordered by MD.  R: Pt compliant with medications, attended group but with minimal interaction. Pt denies SI currently, but states she does feel anxious and overwhelmed at times.

## 2012-02-07 NOTE — H&P (Signed)
  Pt was seen by me today and I agree with the key elements documented in H&P.  

## 2012-02-07 NOTE — Progress Notes (Signed)
D Catherine Costa is out in the milieu....she is becoming adjusted to her environment. She says she feels " very" weak...and asks " isn't there anything else I can take???" This nurse explains meds that she is started on and encourages ( again) pt to drink copious amts of water  And fluids today ( to enhance mucinex she's taking) and she says she understands. Her affect is sad and depressed . SHe is visible on the unit...attends her groups.      A She contracts for safety. She is compliant with her meds.      R Safety is maintaiend and POC includes fostering therapeutic relationship already established PD RN [facility]

## 2012-02-08 DIAGNOSIS — F1210 Cannabis abuse, uncomplicated: Secondary | ICD-10-CM

## 2012-02-08 MED ORDER — ALBUTEROL SULFATE (5 MG/ML) 0.5% IN NEBU
2.5000 mg | INHALATION_SOLUTION | Freq: Three times a day (TID) | RESPIRATORY_TRACT | Status: DC | PRN
  Administered 2012-02-08 – 2012-02-09 (×4): 2.5 mg via RESPIRATORY_TRACT

## 2012-02-08 MED ORDER — ALBUTEROL SULFATE (5 MG/ML) 0.5% IN NEBU
2.5000 mg | INHALATION_SOLUTION | RESPIRATORY_TRACT | Status: DC
  Administered 2012-02-08: 2.5 mg via RESPIRATORY_TRACT
  Filled 2012-02-08 (×2): qty 0.5

## 2012-02-08 MED ORDER — IPRATROPIUM BROMIDE 0.02 % IN SOLN
0.5000 mg | Freq: Three times a day (TID) | RESPIRATORY_TRACT | Status: DC | PRN
  Administered 2012-02-08: 0.5 mg via RESPIRATORY_TRACT

## 2012-02-08 MED ORDER — IPRATROPIUM BROMIDE 0.02 % IN SOLN
0.5000 mg | Freq: Three times a day (TID) | RESPIRATORY_TRACT | Status: DC | PRN
  Administered 2012-02-08 – 2012-02-09 (×3): 0.5 mg via RESPIRATORY_TRACT

## 2012-02-08 NOTE — Progress Notes (Addendum)
Psychoeducational Group Note  Date:  02/08/2012 Time:    Group Topic/Focus:  Healthy Communication:   The focus of this group is to discuss communication, barriers to communication, as well as healthy ways to communicate with others.  Participation Level:  Active  Participation Quality:  Appropriate, Attentive and Sharing  Affect:  Appropriate  Cognitive:  Alert, Appropriate and Oriented  Insight:  Good  Engagement in Group:  Good  Additional Comments:  [name] was active while group discussed positive versus negative communication skills, defined "I feel" statements, practiced using them and read through examples of "I feel" statements. [name] gave several examples, showed insight into past conflicts and how she could use "I feel" statements in the future.   Wandra Scot 02/08/2012, 6:30 PM

## 2012-02-08 NOTE — Progress Notes (Signed)
Psychoeducational Group Note  Date:  02/08/2012 Time:  1000  Group Topic/Focus:  Spirituality:   The focus of this group is to discuss how one's spirituality can aide in recovery.  Participation Level:  Did Not Attend   Additional Comments: Pinkie c/o not feeling well physically and did not attend group on being overly self-reliant.  Wandra Scot 02/08/2012, 10:55 AM

## 2012-02-08 NOTE — Progress Notes (Signed)
Patient ID: DABRIA WADAS, female   DOB: 02-17-1971, 41 y.o.   MRN: [id number]  Seen today. Have cough at times. Poor sleepy due to cough at night. Bad mood due to being sick. Not attending groups today. No fever and getting rx for peumonia   Mental Status Examination/Evaluation:  Objective: Appearance: Fairly Groomed   Psychomotor Activity: Somewhat restless due to cough causing respiratory distress   Eye Contact: Good   Speech: Clear and Coherent and Normal Rate   Volume: Normal   Mood:no good   Affect: ristricted   Thought Process: clear rational goal oriented   Orientation: Full   Thought Content: No AVH/psychosis   Suicidal Thoughts: No   Homicidal Thoughts: No   Judgement: poor   Insight: poor   DIAGNOSIS:  AXIS I  Depressive Disorder nos, cannabis abuse   AXIS II  Deferred   AXIS III  See medical history.  Needs a mammogram hasn't had first yet   AXIS IV  economic problems, educational problems, housing problems, occupational problems, other psychosocial or environmental problems and problems with primary support group   AXIS V  35     Treatment Plan Summary:  Continue current meds Will continue to observe for pneumonia

## 2012-02-08 NOTE — Progress Notes (Signed)
B[facility] Group Notes:  (Counselor/Nursing/MHT/Case Management/Adjunct)  02/08/2012 3:42 PM  Type of Therapy:  Group Therapy  Participation Level:  Active  Participation Quality:  Appropriate, Attentive, Sharing and Supportive  Affect:  Appropriate  Cognitive:  Appropriate  Insight:  Good  Engagement in Group:  Good  Engagement in Therapy:  Good  Modes of Intervention:  Clarification, Education, Socialization and Support  Summary of Progress/Problems: Pt. Participated in group on healthy support systems and each pt. Shared who their supports were in their life and what support means to them. Each patient participated in group exercise on what it means to feel support verse having someone just telling the support you. Each pt. Was given a handout on what is wanted from supporters. And each pt. Was encourage to support them selves and to reach out and communicate for support when their supports are not around. Pt. Began to cough and had to leave group early.  Lamar Blinks [place] 02/08/2012, 3:42 PM

## 2012-02-08 NOTE — Progress Notes (Signed)
Psychoeducational Group Note  Date:  02/08/2012 Time: 1015  Group Topic/Focus:  Identifying Needs:   The focus of this group is to help patients identify their personal needs that have been historically problematic and identify healthy behaviors to address their needs.  Participation Level:  Did Not Attend  02/08/2012,1:39 PM Saksham Akkerman, Joie Bimler

## 2012-02-08 NOTE — Progress Notes (Signed)
Psychoeducational Group Note  Date:  02/08/2012 Time: 1015  Group Topic/Focus:  Identifying Needs:   The focus of this group is to help patients identify their personal needs that have been historically problematic and identify healthy behaviors to address their needs.  Participation Level:  Minimal  Participation Quality:  Drowsy  Affect:  Blunted  Cognitive:  Alert  Insight:  Limited  Engagement in Group:  Limited  Additional Comments:    02/08/2012,6:15 PM Jaspreet Hollings, Joie Bimler

## 2012-02-09 MED ORDER — ALBUTEROL SULFATE HFA 108 (90 BASE) MCG/ACT IN AERS: 2.0000 | INHALATION_SPRAY | Freq: Four times a day (QID) | RESPIRATORY_TRACT | Status: AC | PRN

## 2012-02-09 MED ORDER — NICOTINE 14 MG/24HR TD PT24
MEDICATED_PATCH | TRANSDERMAL | Status: AC
  Administered 2012-02-09: 10:00:00
  Filled 2012-02-09: qty 1

## 2012-02-09 MED ORDER — IBUPROFEN 200 MG PO TABS: 800.0000 mg | ORAL_TABLET | Freq: Four times a day (QID) | ORAL | Status: DC | PRN

## 2012-02-09 MED ORDER — IPRATROPIUM BROMIDE 0.02 % IN SOLN: 0.5000 mg | Freq: Three times a day (TID) | RESPIRATORY_TRACT | Status: DC | PRN

## 2012-02-09 MED ORDER — BENZONATATE 100 MG PO CAPS: 100.0000 mg | ORAL_CAPSULE | Freq: Three times a day (TID) | ORAL | Status: AC

## 2012-02-09 MED ORDER — TRAZODONE HCL 100 MG PO TABS: 100.0000 mg | ORAL_TABLET | Freq: Every evening | ORAL | Status: DC | PRN

## 2012-02-09 MED ORDER — ALBUTEROL SULFATE (5 MG/ML) 0.5% IN NEBU: 2.5000 mg | INHALATION_SOLUTION | Freq: Three times a day (TID) | RESPIRATORY_TRACT | Status: DC | PRN

## 2012-02-09 MED ORDER — GABAPENTIN 100 MG PO CAPS: 100.0000 mg | ORAL_CAPSULE | Freq: Two times a day (BID) | ORAL | Status: DC

## 2012-02-09 MED ORDER — CITALOPRAM HYDROBROMIDE 20 MG PO TABS: 20.0000 mg | ORAL_TABLET | Freq: Every day | ORAL | Status: DC

## 2012-02-09 MED ORDER — GABAPENTIN 100 MG PO CAPS
100.0000 mg | ORAL_CAPSULE | Freq: Two times a day (BID) | ORAL | Status: DC
  Administered 2012-02-09: 100 mg via ORAL
  Filled 2012-02-09 (×3): qty 1

## 2012-02-09 MED ORDER — OMEPRAZOLE 20 MG PO CPDR: 20.0000 mg | DELAYED_RELEASE_CAPSULE | Freq: Two times a day (BID) | ORAL | Status: DC

## 2012-02-09 MED ORDER — NICOTINE 21 MG/24HR TD PT24
MEDICATED_PATCH | TRANSDERMAL | Status: AC
  Filled 2012-02-09: qty 1

## 2012-02-09 MED ORDER — LEVOFLOXACIN 750 MG PO TABS: 750.0000 mg | ORAL_TABLET | Freq: Every day | ORAL | Status: DC

## 2012-02-09 MED ORDER — GUAIFENESIN ER 600 MG PO TB12: 600.0000 mg | ORAL_TABLET | Freq: Two times a day (BID) | ORAL | Status: DC | PRN

## 2012-02-09 NOTE — Progress Notes (Signed)
[facility] Case Management Discharge Plan:  Will you be returning to the same living situation after discharge: Yes,  return to own home At discharge, do you have transportation home?:Yes,  pt has transportation home Do you have the ability to pay for your medications:Yes,  access to meds   Release of information consent forms completed and in the chart;  Patient's signature needed at discharge.  Patient to Follow up at:  Follow-up Information    Follow up with Arna Medici on 02/11/2012. (Appointment scheduled at 7:45 am, referral # [id number])    Contact information:   [address] [place], [place] [address] [phone]         Patient denies SI/HI:   Yes,  denies SI/HI    Safety Planning and Suicide Prevention discussed:  Yes,  discussed with pt today  Barrier to discharge identified:No.  Summary and Recommendations: Pt attended discharge planning group and actively participated in group.  SW provided pt with today's workbook.  Pt presents with calm mood and affect.  Pt denies having depression, anxiety and SI today.  Pt reports feeling stable to d/c.  No recommendations from SW.  No further needs voiced by pt.  Pt stable to discharge.     Carmina Miller 02/09/2012, 12:55 PM

## 2012-02-09 NOTE — Progress Notes (Signed)
[facility] Group Notes:  (Counselor/Nursing/MHT/Case Management/Adjunct)  02/09/2012 1:37 AM  Type of Therapy:  Psychoeducational Skills  Participation Level:  Active  Participation Quality:  Appropriate  Affect:  Appropriate  Cognitive:  Appropriate  Insight:  Good  Engagement in Group:  Good  Engagement in Therapy:  Good  Modes of Intervention:  Education  Summary of Progress/Problems:The pt. Verbalized that  She didn't have a very good day due to her bad cough. She states that the breathing treatments have helped, but she still feels sick. Her goal for tomorrow is to get some information about treatment centers.    Zakk Borgen S 02/09/2012, 1:37 AM

## 2012-02-09 NOTE — Progress Notes (Signed)
D Munirah cont to struggle with coughing, feeling sick and recuperating from pneumonia. She maintains a sad, depressed affect. She interacts appropriately with her peers, she attends her groups as planned, she is engaged in her POC, processing to understand her feelings and her related behaviors. SHe completes her AM self inventory and on it she writes she denies feeling SI within the past 24 hrs, she rates her depression and hopelessness "1/1" and states her DC plan is to " exercise more and worry less".                         A She  Received a prn breathing treatment for cough and SOB at 0805, which was effective.                         R Safety is in place, POC is maintained and therapeutic relationship is fostered PD RN [facility]

## 2012-02-09 NOTE — Tx Team (Signed)
Interdisciplinary Treatment Plan Update (Adult)  Date:  02/09/2012  Time Reviewed:  10:51 AM   Progress in Treatment: Attending groups: Yes Participating in groups:  Yes Taking medication as prescribed: Yes Tolerating medication:  Yes Family/Significant other contact made: Yes Patient understands diagnosis:  Yes Discussing patient identified problems/goals with staff:  Yes Medical problems stabilized or resolved:  Yes Denies suicidal/homicidal ideation: Yes Issues/concerns per patient self-inventory:  None identified Other: N/A  New problem(s) identified: None Identified  Reason for Continuation of Hospitalization: Stable to d/c  Interventions implemented related to continuation of hospitalization: Stable to d/c  Additional comments: N/A  Estimated length of stay: D/C today  Discharge Plan: Pt will follow up with Arna Medici for medication management and therapy.    New goal(s): N/A  Review of initial/current patient goals per problem list:    1.  Goal(s): Reduce depressive symptoms  Met:  Yes  Target date: by discharge  As evidenced by: Reducing depression from a 10 to a 3 as reported by pt.  Pt rates at a 0 today.   2.  Goal (s): Reduce/Eliminate suicidal ideation  Met:  Yes  Target date: by discharge  As evidenced by: pt reporting no SI.    3.  Goal(s): Reduce anxiety symptoms  Met:  Yes  Target date: by discharge  As evidenced by: Reduce anxiety from a 10 to a 3 as reported by pt. Pt rates at a 0 today.     Attendees: Patient:  Catherine Costa 02/09/2012 10:51 AM   Family:     Physician:  Orson Aloe, MD 02/09/2012 10:51 AM   Nursing:   Neill Loft, RN 02/09/2012 10:51 AM   Case Manager:  Reyes Ivan, LCSWA 02/09/2012 10:51 AM   Counselor:  Angus Palms, LCSW 02/09/2012 10:51 AM   Other:  Juline Patch, LCSW 02/09/2012 10:51 AM   Other:    Other:     Other:      Scribe for Treatment Team:   Carmina Miller, 02/09/2012 10:51 AM

## 2012-02-09 NOTE — [facility] Suicide Risk Assessment (Signed)
Suicide Risk Assessment  Discharge Assessment     Demographic factors:  Caucasian;Low socioeconomic status;Unemployed;Access to firearms    Current Mental Status Per Nursing Assessment::   On Admission:  Belief that plan would result in death At Discharge:     Current Mental Status Per Physician: Patient denies suicidal or homicidal ideation, hallucinations, illusions, or delusions. Patient engages with good eye contact, is able to focus adequately in a one to one setting, and has clear goal directed thoughts. Patient speaks with a natural conversational volume, rate, and tone. Anxiety was reported at 5 on a scale of 1 the least and 10 the most. Depression was reported at 1 on the same scale. Patient is oriented times 4, recent and remote memory intact. Judgement: somewhat attenuated by her depression, anxiety, and affinity for Klonopin Insight: limited  Loss Factors: Has lost her home, job, and health  Historical Factors: Prior suicide attempts  Continued Clinical Symptoms:  Severe Anxiety and/or Agitation Depression:   Insomnia Chronic Pain Previous Psychiatric Diagnoses and Treatments  Discharge Diagnoses:   AXIS I: Depressive Disorder nos, cannabis abuse  AXIS II: Deferred  AXIS III: See medical history.  Needs a mammogram hasn't had first yet  AXIS IV: economic problems, educational problems, housing problems, occupational problems, other psychosocial or environmental problems and problems with primary support group  AXIS V: 70   Cognitive Features That Contribute To Risk:  Thought constriction (tunnel vision)    Suicide Risk:  Minimal: No identifiable suicidal ideation.  Patients presenting with no risk factors but with morbid ruminations; may be classified as minimal risk based on the severity of the depressive symptoms  Labs: No results found for this or any previous visit (from the past 72 hour(s)).  RISK REDUCTION FACTORS: What pt has learned from  hospital stay is that their life is worth saving.  Risk of self harm is elevated by her depression, anxiety, and potential for addictions, but they have decided that they have themselves to live for as well as their children and grand children  Risk of harm to others is minimal in that she has not been involved in fights or had any legal charges filed on her.  Pt seen in treatment team where she divulged the above information. The treatment team concluded that she was ready for discharge and had met her goals for an inpatient setting.  PLAN: Discharge home Continue Medication List  As of 02/09/2012  3:21 PM   STOP taking these medications         clonazePAM 1 MG tablet         TAKE these medications      Indication    albuterol 108 (90 BASE) MCG/ACT inhaler   Commonly known as: PROVENTIL HFA;VENTOLIN HFA   Inhale 2 puffs into the lungs every 6 (six) hours as needed for wheezing or shortness of breath.       albuterol (5 MG/ML) 0.5% nebulizer solution   Commonly known as: PROVENTIL   Take 0.5 mLs (2.5 mg total) by nebulization 3 (three) times daily as needed for wheezing or shortness of breath.       benzonatate 100 MG capsule   Commonly known as: TESSALON   Take 1 capsule (100 mg total) by mouth 3 (three) times daily. For cough       citalopram 20 MG tablet   Commonly known as: CELEXA   Take 1 tablet (20 mg total) by mouth daily. For depression.  gabapentin 100 MG capsule   Commonly known as: NEURONTIN   Take 1 capsule (100 mg total) by mouth 2 (two) times daily. For anxiety       guaiFENesin 600 MG 12 hr tablet   Commonly known as: MUCINEX   Take 1 tablet (600 mg total) by mouth 2 (two) times daily as needed for congestion.    Indication: Cough      ibuprofen 200 MG tablet   Commonly known as: ADVIL,MOTRIN   Take 4 tablets (800 mg total) by mouth every 6 (six) hours as needed for pain or headache.       ipratropium 0.02 % nebulizer solution   Commonly known as:  ATROVENT   Take 2.5 mLs (0.5 mg total) by nebulization 3 (three) times daily as needed for wheezing.       levofloxacin 750 MG tablet   Commonly known as: LEVAQUIN   Take 1 tablet (750 mg total) by mouth daily. For pneumonia       omeprazole 20 MG capsule   Commonly known as: PRILOSEC   Take 1 capsule (20 mg total) by mouth 2 (two) times daily. For control of stomach acid secretion and helps GERD.       traZODone 100 MG tablet   Commonly known as: DESYREL   Take 1 tablet (100 mg total) by mouth at bedtime as needed (insomnia).            Follow-up recommendations:  Activities: Resume typical activities Diet: Resume typical diet Tests: none Other: Follow up with outpatient provider and report any side effects to out patient prescriber.   Hema Lanza 02/09/2012, 3:10 PM

## 2012-02-09 NOTE — Progress Notes (Signed)
Patient ID: Catherine Costa, female   DOB: March 02, 1971, 41 y.o.   MRN: [id number] Patient has order for d/c and denies SI. She verbalized understanding of follow up and her medications. Mood appeared stable at time of d/c. Patient was very excited to see her granddaughter and seemed very happy.

## 2012-02-09 NOTE — Progress Notes (Signed)
Patient observed after group sitting in the dayroom watching tv. Patient reported having pain in her back chest and head and received a prn of ibuprofen. Writer inquired about her cough and if her medications seemed to help. Patient reported that she had to have a prn duoneb tx earlier today d/t her coughing and sob. Patient informed of her medication scheduled and available prns if needed. Patient encouraged to continue to take in plenty of fluids to prevent dehydration. Patient appears to look slightly better than last evening in appearance. Safety maintained on unit, will continue to monitor.

## 2012-02-09 NOTE — Progress Notes (Signed)
Psychoeducational Group Note  Date:  02/09/2012 Time:  1100  Group Topic/Focus:  Wellness Toolbox:   The focus of this group is to discuss various aspects of wellness, balancing those aspects and exploring ways to increase the ability to experience wellness.  Patients will create a wellness toolbox for use upon discharge.  Participation Level:  Active  Participation Quality:  Appropriate, Sharing and Supportive  Affect:  Appropriate  Cognitive:  Appropriate  Insight:  Good  Engagement in Group:  Good  Additional Comments:  none  Candice Tobey M 02/09/2012, 1:46 PM

## 2012-02-09 NOTE — Progress Notes (Signed)
[facility] Group Notes:  (Counselor/Nursing/MHT/Case Management/Adjunct) 02/09/2012  1:15pm-2:30pm   Type of Therapy:  Group Therapy  Participation Level:  Did Not Attend - Catherine Costa was preparing for discharge during group     Angus Palms, LCSW 02/09/2012  3:38 PM
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
## 2012-02-10 NOTE — Progress Notes (Signed)
Patient Discharge Instructions:  After Visit Summary (AVS):   Faxed to:  02/10/2012 Psychiatric Admission Assessment Note:   Faxed to:  02/10/2012 Suicide Risk Assessment - Discharge Assessment:   Faxed to:  02/10/2012 Faxed/Sent to the Next Level Care provider:  02/10/2012  Faxed to [facility] @ [phone]  Heloise Purpura, Eduard Clos, 02/10/2012, 5:11 PM

## 2012-02-11 ENCOUNTER — Emergency Department (HOSPITAL_COMMUNITY): Payer: Medicaid Other

## 2012-02-11 ENCOUNTER — Encounter (HOSPITAL_COMMUNITY): Payer: Self-pay | Admitting: *Deleted

## 2012-02-11 ENCOUNTER — Emergency Department (HOSPITAL_COMMUNITY)
Admission: EM | Admit: 2012-02-11 | Discharge: 2012-02-13 | Disposition: A | Discharge status: Other Acute Inpatient Hospital | Payer: Medicaid Other | Attending: Emergency Medicine | Admitting: Emergency Medicine

## 2012-02-11 DIAGNOSIS — J4489 Other specified chronic obstructive pulmonary disease: Secondary | ICD-10-CM | POA: Insufficient documentation

## 2012-02-11 DIAGNOSIS — K5090 Crohn's disease, unspecified, without complications: Secondary | ICD-10-CM | POA: Insufficient documentation

## 2012-02-11 DIAGNOSIS — F17200 Nicotine dependence, unspecified, uncomplicated: Secondary | ICD-10-CM | POA: Insufficient documentation

## 2012-02-11 DIAGNOSIS — R079 Chest pain, unspecified: Secondary | ICD-10-CM | POA: Insufficient documentation

## 2012-02-11 DIAGNOSIS — J40 Bronchitis, not specified as acute or chronic: Secondary | ICD-10-CM

## 2012-02-11 DIAGNOSIS — F3289 Other specified depressive episodes: Secondary | ICD-10-CM | POA: Insufficient documentation

## 2012-02-11 DIAGNOSIS — F329 Major depressive disorder, single episode, unspecified: Secondary | ICD-10-CM

## 2012-02-11 DIAGNOSIS — Z79899 Other long term (current) drug therapy: Secondary | ICD-10-CM | POA: Insufficient documentation

## 2012-02-11 DIAGNOSIS — R45851 Suicidal ideations: Secondary | ICD-10-CM

## 2012-02-11 DIAGNOSIS — J449 Chronic obstructive pulmonary disease, unspecified: Secondary | ICD-10-CM

## 2012-02-11 HISTORY — DX: Depression, unspecified: F32.A

## 2012-02-11 HISTORY — DX: Major depressive disorder, single episode, unspecified: F32.9

## 2012-02-11 HISTORY — DX: Crohn's disease, unspecified, without complications: K50.90

## 2012-02-11 HISTORY — DX: Pneumonia, unspecified organism: J18.9

## 2012-02-11 LAB — URINALYSIS, ROUTINE W REFLEX MICROSCOPIC
Bilirubin Urine: NEGATIVE
Glucose, UA: NEGATIVE mg/dL
Specific Gravity, Urine: >1.03 — ABNORMAL HIGH (ref 1.005–1.030)
pH: 5.5 (ref 5.0–8.0)

## 2012-02-11 LAB — ETHANOL: Alcohol, Ethyl (B): <11 mg/dL (ref 0–11)

## 2012-02-11 LAB — COMPREHENSIVE METABOLIC PANEL
AST: 14 U/L (ref 0–37)
BUN: 12 mg/dL (ref 6–23)
CO2: 24 mEq/L (ref 19–32)
Calcium: 9.3 mg/dL (ref 8.4–10.5)
Creatinine, Ser: 0.75 mg/dL (ref 0.50–1.10)
GFR calc Af Amer: >90 mL/min (ref >90)
GFR calc non Af Amer: >90 mL/min (ref >90)

## 2012-02-11 LAB — URINE MICROSCOPIC-ADD ON

## 2012-02-11 LAB — CBC WITH DIFFERENTIAL/PLATELET
Basophils Absolute: 0 10*3/uL (ref 0.0–0.1)
Eosinophils Relative: 5 % (ref 0–5)
HCT: 37.9 % (ref 36.0–46.0)
Hemoglobin: 13.1 g/dL (ref 12.0–15.0)
Lymphocytes Relative: 33 % (ref 12–46)
MCHC: 34.6 g/dL (ref 30.0–36.0)
MCV: 90 fL (ref 78.0–100.0)
Monocytes Absolute: 0.5 10*3/uL (ref 0.1–1.0)
Monocytes Relative: 6 % (ref 3–12)
RDW: 12.8 % (ref 11.5–15.5)
WBC: 7.3 10*3/uL (ref 4.0–10.5)

## 2012-02-11 LAB — RAPID URINE DRUG SCREEN, HOSP PERFORMED
Cocaine: NOT DETECTED
Opiates: NOT DETECTED
Tetrahydrocannabinol: POSITIVE — AB

## 2012-02-11 MED ORDER — IPRATROPIUM BROMIDE 0.02 % IN SOLN
0.5000 mg | Freq: Once | RESPIRATORY_TRACT | Status: AC
  Administered 2012-02-11: 0.5 mg via RESPIRATORY_TRACT
  Filled 2012-02-11: qty 2.5

## 2012-02-11 MED ORDER — ALBUTEROL SULFATE (5 MG/ML) 0.5% IN NEBU
5.0000 mg | INHALATION_SOLUTION | Freq: Once | RESPIRATORY_TRACT | Status: AC
  Administered 2012-02-11: 5 mg via RESPIRATORY_TRACT
  Filled 2012-02-11: qty 1

## 2012-02-11 MED ORDER — ONDANSETRON HCL 4 MG PO TABS: 4.0000 mg | ORAL_TABLET | Freq: Three times a day (TID) | ORAL | Status: DC | PRN

## 2012-02-11 MED ORDER — GUAIFENESIN ER 600 MG PO TB12
600.0000 mg | ORAL_TABLET | Freq: Two times a day (BID) | ORAL | Status: DC | PRN
  Filled 2012-02-11 (×5): qty 1

## 2012-02-11 MED ORDER — GABAPENTIN 100 MG PO CAPS
ORAL_CAPSULE | ORAL | Status: AC
  Filled 2012-02-11: qty 1

## 2012-02-11 MED ORDER — LEVOFLOXACIN 500 MG PO TABS
750.0000 mg | ORAL_TABLET | Freq: Every day | ORAL | Status: DC
  Administered 2012-02-11 – 2012-02-13 (×3): 750 mg via ORAL
  Filled 2012-02-11 (×3): qty 1

## 2012-02-11 MED ORDER — ZOLPIDEM TARTRATE 5 MG PO TABS: 10.0000 mg | ORAL_TABLET | Freq: Every evening | ORAL | Status: DC | PRN

## 2012-02-11 MED ORDER — TRAZODONE HCL 50 MG PO TABS
100.0000 mg | ORAL_TABLET | Freq: Every evening | ORAL | Status: DC | PRN
  Administered 2012-02-12: 100 mg via ORAL
  Filled 2012-02-11 (×3): qty 2

## 2012-02-11 MED ORDER — IBUPROFEN 400 MG PO TABS
600.0000 mg | ORAL_TABLET | Freq: Three times a day (TID) | ORAL | Status: DC | PRN
  Administered 2012-02-11 – 2012-02-13 (×4): 600 mg via ORAL
  Filled 2012-02-11 (×3): qty 2

## 2012-02-11 MED ORDER — ALBUTEROL SULFATE HFA 108 (90 BASE) MCG/ACT IN AERS
2.0000 | INHALATION_SPRAY | Freq: Four times a day (QID) | RESPIRATORY_TRACT | Status: DC | PRN
  Administered 2012-02-12 – 2012-02-13 (×4): 2 via RESPIRATORY_TRACT
  Filled 2012-02-11: qty 6.7

## 2012-02-11 MED ORDER — PANTOPRAZOLE SODIUM 40 MG PO TBEC
40.0000 mg | DELAYED_RELEASE_TABLET | Freq: Every day | ORAL | Status: DC
  Administered 2012-02-12 – 2012-02-13 (×2): 40 mg via ORAL
  Filled 2012-02-11 (×3): qty 1

## 2012-02-11 MED ORDER — ACETAMINOPHEN 325 MG PO TABS: 650.0000 mg | ORAL_TABLET | ORAL | Status: DC | PRN

## 2012-02-11 MED ORDER — GABAPENTIN 100 MG PO CAPS
100.0000 mg | ORAL_CAPSULE | Freq: Two times a day (BID) | ORAL | Status: DC
  Administered 2012-02-11 – 2012-02-13 (×4): 100 mg via ORAL
  Filled 2012-02-11 (×8): qty 1

## 2012-02-11 MED ORDER — ALUM & MAG HYDROXIDE-SIMETH 200-200-20 MG/5ML PO SUSP: 30.0000 mL | ORAL | Status: DC | PRN

## 2012-02-11 MED ORDER — CITALOPRAM HYDROBROMIDE 20 MG PO TABS
20.0000 mg | ORAL_TABLET | Freq: Every day | ORAL | Status: DC
  Administered 2012-02-11 – 2012-02-13 (×3): 20 mg via ORAL
  Filled 2012-02-11 (×5): qty 1

## 2012-02-11 MED ORDER — NICOTINE 21 MG/24HR TD PT24
21.0000 mg | MEDICATED_PATCH | Freq: Every day | TRANSDERMAL | Status: DC
  Administered 2012-02-11 – 2012-02-13 (×3): 21 mg via TRANSDERMAL
  Filled 2012-02-11 (×3): qty 1

## 2012-02-11 MED ORDER — LORAZEPAM 1 MG PO TABS
1.0000 mg | ORAL_TABLET | Freq: Three times a day (TID) | ORAL | Status: DC | PRN
  Administered 2012-02-12 – 2012-02-13 (×4): 1 mg via ORAL
  Filled 2012-02-11 (×4): qty 1

## 2012-02-11 MED ORDER — CITALOPRAM HYDROBROMIDE 20 MG PO TABS
ORAL_TABLET | ORAL | Status: AC
  Filled 2012-02-11: qty 1
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
## 2012-02-11 NOTE — ED Notes (Signed)
Recent adm to [facility].  Now tearful, "I want to die". Alert.

## 2012-02-11 NOTE — ED Provider Notes (Signed)
History    This chart was scribed for Catherine Givens, MD, MD by [name]. The patient was seen in room Catherine Costa and the patient's care was started at 4:34PM.   CSN: [id number]  Arrival date & time 02/11/12  1524   First MD Initiated Contact with Patient 02/11/12 1550      Chief Complaint  Patient presents with  . Medical Clearance    (Consider location/radiation/quality/duration/timing/severity/associated sxs/prior treatment) The history is provided by the patient.   Catherine Costa is a 41 y.o. female who presents to the Emergency Department complaining of SI and depression onset 1 day ago. Pt reports having stressful personal life with divorce and 80 year old daughter having thyroid cancer. Pt was admitted to [facility] this past Friday, August 9 and being discharged Monday, August 12th. Pt reports feeling better upon discharge. Pt reports that she has had an argument with her grandmother within past day. Pt lives with her daughter. She has not taken her medication since discharged due to not having money for prescription, including her antibiotic which she was prescribed for pneumonia.     Pt reports smoking 0.5 packs/day. Pt reports using marijuana last week.   PCP Dr Neita Carp  Past Medical History  Diagnosis Date  . Pneumonia   . Depression   . Crohn's disease     Past Surgical History  Procedure Date  . Abdominal hysterectomy   . Fracture surgery     History reviewed. No pertinent family history.  History  Substance Use Topics  . Smoking status: Current Everyday Smoker  . Smokeless tobacco: Not on file  . Alcohol Use: No  lives with daughter Divorced unemployed  OB History    Grav Para Term Preterm Abortions TAB SAB Ect Mult Living                  Review of Systems  Respiratory: Positive for cough.   Psychiatric/Behavioral: Positive for suicidal ideas.  All other systems reviewed and are negative.  10 Systems reviewed and all are negative for  acute change except as noted in the HPI.    Allergies  Bee venom; Sulfa antibiotics; and Voltaren  Home Medications   Current Outpatient Rx  Name Route Sig Dispense Refill  . ALBUTEROL SULFATE HFA 108 (90 BASE) MCG/ACT IN AERS Inhalation Inhale 2 puffs into the lungs every 6 (six) hours as needed for wheezing or shortness of breath. 3.7 g 0  . ALBUTEROL SULFATE (5 MG/ML) 0.5% IN NEBU Nebulization Take 0.5 mLs (2.5 mg total) by nebulization 3 (three) times daily as needed for wheezing or shortness of breath. 20 mL 0  . BENZONATATE 100 MG PO CAPS Oral Take 1 capsule (100 mg total) by mouth 3 (three) times daily. For cough 20 capsule 0  . CITALOPRAM HYDROBROMIDE 20 MG PO TABS Oral Take 1 tablet (20 mg total) by mouth daily. For depression. 30 tablet 0  . GABAPENTIN 100 MG PO CAPS Oral Take 1 capsule (100 mg total) by mouth 2 (two) times daily. For anxiety 60 capsule 0  . GUAIFENESIN ER 600 MG PO TB12 Oral Take 1 tablet (600 mg total) by mouth 2 (two) times daily as needed for congestion. 60 tablet 0  . IBUPROFEN 200 MG PO TABS Oral Take 4 tablets (800 mg total) by mouth every 6 (six) hours as needed for pain or headache. 30 tablet 0  . IPRATROPIUM BROMIDE 0.02 % IN SOLN Nebulization Take 2.5 mLs (0.5 mg total) by  nebulization 3 (three) times daily as needed for wheezing. 75 mL 0  . LEVOFLOXACIN 750 MG PO TABS Oral Take 1 tablet (750 mg total) by mouth daily. For pneumonia 9 tablet 0  . OMEPRAZOLE 20 MG PO CPDR Oral Take 1 capsule (20 mg total) by mouth 2 (two) times daily. For control of stomach acid secretion and helps GERD. 60 capsule 0  . TRAZODONE HCL 100 MG PO TABS Oral Take 1 tablet (100 mg total) by mouth at bedtime as needed (insomnia). 45 tablet 0    BP 136/103  Pulse 107  Temp 98.6 F (37 C) (Oral)  Resp 18  Ht 5\' 6"  (1.676 m)  Wt 171 lb (77.565 kg)  BMI 27.60 kg/m2  SpO2 96%  Vital signs normal    Physical Exam  Nursing note and vitals reviewed. Constitutional: She is  oriented to person, place, and time. She appears well-developed and well-nourished.  HENT:  Head: Normocephalic and atraumatic.       Tongue is dry   Neck: Normal range of motion. Neck supple.  Cardiovascular: Normal rate, regular rhythm and normal heart sounds.   Pulmonary/Chest: She has wheezes (diffuse). She has rhonchi (diffuse).  Abdominal: Soft. She exhibits no distension. There is no tenderness. There is no rebound.  Neurological: She is alert and oriented to person, place, and time.  Skin: Skin is warm and dry.    ED Course  Procedures (including critical care time)   Medications  albuterol (PROVENTIL) (5 MG/ML) 0.5% nebulizer solution 5 mg (5 mg Nebulization Given 02/11/12 1738)  ipratropium (ATROVENT) nebulizer solution 0.5 mg (0.5 mg Nebulization Given 02/11/12 1738)    DIAGNOSTIC STUDIES: Oxygen Saturation is 96% on room air, normal by my interpretation.    COORDINATION OF CARE: 4:44PM EDP discusses pt ED treatment with pt   18:48 Samson Frederic, ACT here in ED will evaluate pt.   Results for orders placed during the hospital encounter of 02/11/12  CBC WITH DIFFERENTIAL      Component Value Range   WBC 7.3  4.0 - 10.5 K/uL   RBC 4.21  3.87 - 5.11 MIL/uL   Hemoglobin 13.1  12.0 - 15.0 g/dL   HCT 16.1  09.6 - 04.5 %   MCV 90.0  78.0 - 100.0 fL   MCH 31.1  26.0 - 34.0 pg   MCHC 34.6  30.0 - 36.0 g/dL   RDW 40.9  81.1 - 91.4 %   Platelets 209  150 - 400 K/uL   Neutrophils Relative 56  43 - 77 %   Neutro Abs 4.1  1.7 - 7.7 K/uL   Lymphocytes Relative 33  12 - 46 %   Lymphs Abs 2.4  0.7 - 4.0 K/uL   Monocytes Relative 6  3 - 12 %   Monocytes Absolute 0.5  0.1 - 1.0 K/uL   Eosinophils Relative 5  0 - 5 %   Eosinophils Absolute 0.3  0.0 - 0.7 K/uL   Basophils Relative 0  0 - 1 %   Basophils Absolute 0.0  0.0 - 0.1 K/uL  COMPREHENSIVE METABOLIC PANEL      Component Value Range   Sodium 139  135 - 145 mEq/L   Potassium 3.7  3.5 - 5.1 mEq/L   Chloride 107  96 - 112 mEq/L     CO2 24  19 - 32 mEq/L   Glucose, Bld 92  70 - 99 mg/dL   BUN 12  6 - 23 mg/dL   Creatinine, Ser  0.75  0.50 - 1.10 mg/dL   Calcium 9.3  8.4 - 84.1 mg/dL   Total Protein 6.8  6.0 - 8.3 g/dL   Albumin 3.3 (*) 3.5 - 5.2 g/dL   AST 14  0 - 37 U/L   ALT 12  0 - 35 U/L   Alkaline Phosphatase 67  39 - 117 U/L   Total Bilirubin 0.2 (*) 0.3 - 1.2 mg/dL   GFR calc non Af Amer >90  >90 mL/min   GFR calc Af Amer >90  >90 mL/min  ETHANOL      Component Value Range   Alcohol, Ethyl (B) <11  0 - 11 mg/dL  URINE RAPID DRUG SCREEN (HOSP PERFORMED)      Component Value Range   Opiates NONE DETECTED  NONE DETECTED   Cocaine NONE DETECTED  NONE DETECTED   Benzodiazepines NONE DETECTED  NONE DETECTED   Amphetamines NONE DETECTED  NONE DETECTED   Tetrahydrocannabinol POSITIVE (*) NONE DETECTED   Barbiturates NONE DETECTED  NONE DETECTED  URINALYSIS, ROUTINE W REFLEX MICROSCOPIC      Component Value Range   Color, Urine AMBER (*) YELLOW   APPearance CLEAR  CLEAR   Specific Gravity, Urine >1.030 (*) 1.005 - 1.030   pH 5.5  5.0 - 8.0   Glucose, UA NEGATIVE  NEGATIVE mg/dL   Hgb urine dipstick TRACE (*) NEGATIVE   Bilirubin Urine NEGATIVE  NEGATIVE   Ketones, ur NEGATIVE  NEGATIVE mg/dL   Protein, ur NEGATIVE  NEGATIVE mg/dL   Urobilinogen, UA 0.2  0.0 - 1.0 mg/dL   Nitrite NEGATIVE  NEGATIVE   Leukocytes, UA NEGATIVE  NEGATIVE  URINE MICROSCOPIC-ADD ON      Component Value Range   Squamous Epithelial / LPF FEW (*) RARE   WBC, UA 0-2  <3 WBC/hpf   RBC / HPF 0-2  <3 RBC/hpf   Bacteria, UA RARE  RARE   Laboratory interpretation all normal except concentrated urine   Dg Chest 2 View  02/11/2012  *RADIOLOGY REPORT*  Clinical Data: Chest pain since Thursday.  CHEST - 2 VIEW  Comparison: PA and lateral chest 02/05/2012 and 12/28/2004.  Findings: Lungs are clear.  No pneumothorax or pleural fluid. Heart size normal.  IMPRESSION: Negative chest.  Original Report Authenticated By: Bernadene Bell.  D'ALESSIO, M.D.     1. Depression   2. Suicidal ideation   3. Bronchitis   4. COPD (chronic obstructive pulmonary disease)    Plan admission per ACT   MDM    I personally performed the services described in this documentation, which was scribed in my presence. The recorded information has been reviewed and considered.  Devoria Albe, MD, Armando Gang   Catherine Givens, MD 02/11/12 2352

## 2012-02-12 MED ORDER — FLUCONAZOLE 100 MG PO TABS
200.0000 mg | ORAL_TABLET | Freq: Every day | ORAL | Status: DC
  Administered 2012-02-12 – 2012-02-13 (×2): 200 mg via ORAL
  Filled 2012-02-12 (×2): qty 2

## 2012-02-12 MED ORDER — ALBUTEROL SULFATE HFA 108 (90 BASE) MCG/ACT IN AERS
2.0000 | INHALATION_SPRAY | Freq: Once | RESPIRATORY_TRACT | Status: AC
  Administered 2012-02-12: 2 via RESPIRATORY_TRACT

## 2012-02-12 NOTE — ED Notes (Signed)
Patient asking for a breathing treatment. Relayed request to [employer] .

## 2012-02-12 NOTE — ED Notes (Addendum)
Patient asking to see nurse for something to help her sleep, also she wanted to see the Doctor. Nurse was notified

## 2012-02-12 NOTE — ED Notes (Signed)
Pt given dinner tray.

## 2012-02-12 NOTE — ED Notes (Signed)
Received report on pt, pt lying in bed, has flat effect, sitter at bedside,

## 2012-02-12 NOTE — ED Notes (Signed)
Pt requesting fan to be placed in room, advised pt that a fan could be placed outside the room pt agrees

## 2012-02-12 NOTE — ED Notes (Signed)
Pt complains of increased anxiety. 1mg  ativan given per PRN order

## 2012-02-12 NOTE — ED Notes (Signed)
Meal given

## 2012-02-12 NOTE — BH Assessment (Signed)
Assessment Note   Catherine Costa is an 41 y.o. female. Patient continues to endorse Suicidality and inability to contract for safety. Patient has been accepted by Dr. Hilton Cork to the services of Dr. Dan Humphreys pending bed availability. Spoke with Tanna Savoy who stated there would not be an available bed today.  Axis I: Major Depression, Recurrent severe Axis II: Deferred Axis III:  Past Medical History  Diagnosis Date  . Pneumonia   . Depression   . Crohn's disease    Axis IV: other psychosocial or environmental problems, problems related to social environment and problems with primary support group Axis V: 30  Past Medical History:  Past Medical History  Diagnosis Date  . Pneumonia   . Depression   . Crohn's disease     Past Surgical History  Procedure Date  . Abdominal hysterectomy   . Fracture surgery     Family History: History reviewed. No pertinent family history.  Social History:  reports that she has been smoking.  She does not have any smokeless tobacco history on file. She reports that she uses illicit drugs (Marijuana). She reports that she does not drink alcohol.  Additional Social History:  Alcohol / Drug Use Pain Medications: na Prescriptions: na Over the Counter: na History of alcohol / drug use?: Yes Substance #1 Name of Substance 1: MARIJUANA 1 - Age of First Use: 11 1 - Amount (size/oz): 1 JOINT 1 - Frequency: 1 TIME IN EIGHT YEARS 1 - Duration: 1 NIGHT 1 - Last Use / Amount: 2 WEEKENDS AGO 1 JOINT  CIWA: CIWA-Ar BP: 123/89 mmHg Pulse Rate: 75  COWS:    Allergies:  Allergies  Allergen Reactions  . Bee Venom Anaphylaxis  . Sulfa Antibiotics Other (See Comments)    REACTION: Affects Colon  . Voltaren (Diclofenac Sodium) Other (See Comments)    REACTION: Affects Colon    Home Medications:  (Not in a hospital admission)  OB/GYN Status:  No LMP recorded. Patient has had a hysterectomy.  General Assessment Data Location of Assessment: [facility]  ED ACT Assessment: Yes Living Arrangements: Other relatives Can pt return to current living arrangement?: Yes Admission Status: Voluntary Is patient capable of signing voluntary admission?: Yes Transfer from: Acute Hospital Referral Source: MD  Education Status Contact person: [place] PYRTLE-DAUGHTER-[phone]  Risk to self Suicidal Ideation: Yes-Currently Present Suicidal Intent: Yes-Currently Present Is patient at risk for suicide?: Yes Suicidal Plan?: Yes-Currently Present Specify Current Suicidal Plan:  (Shoot self) Access to Means: Yes Specify Access to Suicidal Means:  (Guns) What has been your use of drugs/alcohol within the last 12 months?:  (THC) Previous Attempts/Gestures: No How many times?: 1  (ONE UNREPORTED OVERDOSE 1 YEAR AGO ; 1 THREAT) Other Self Harm Risks: NA Triggers for Past Attempts: Family contact;Spouse contact;Other (Comment) Intentional Self Injurious Behavior: None Family Suicide History: No Recent stressful life event(s): Conflict (Comment) Persecutory voices/beliefs?: No Depression: Yes Depression Symptoms: Despondent;Feeling angry/irritable;Feeling worthless/self pity;Loss of interest in usual pleasures;Tearfulness Substance abuse history and/or treatment for substance abuse?: Yes Suicide prevention information given to non-admitted patients: Not applicable  Risk to Others Homicidal Ideation: No Thoughts of Harm to Others: No Current Homicidal Intent: No Current Homicidal Plan: No Access to Homicidal Means: No History of harm to others?: No Assessment of Violence: None Noted Violent Behavior Description: NA Does patient have access to weapons?: Yes (Comment) (4 guns) Criminal Charges Pending?: No Does patient have a court date: No  Psychosis Hallucinations: None noted Delusions: None noted  Mental Status  Report Appear/Hygiene: Disheveled;Poor hygiene Eye Contact: Good Motor Activity: Freedom of movement Speech:  Logical/coherent Level of Consciousness: Alert;Crying Mood: Depressed;Worthless, low self-esteem;Helpless;Sad;Irritable Affect: Sad;Depressed Anxiety Level: Minimal Thought Processes: Coherent Judgement: Impaired Orientation: Person;Place;Time;Situation Obsessive Compulsive Thoughts/Behaviors: None  Cognitive Functioning Concentration: Normal Memory: Recent Intact;Remote Intact IQ: Average Insight: Poor Impulse Control: Poor Appetite: Fair Sleep: No Change Total Hours of Sleep:  (4) Vegetative Symptoms: None  ADLScreening [facility] Assessment Services) Patient's cognitive ability adequate to safely complete daily activities?: Yes Patient able to express need for assistance with ADLs?: Yes Independently performs ADLs?: Yes (appropriate for developmental age)  Abuse/Neglect [facility]) Physical Abuse: Denies Verbal Abuse: Denies Sexual Abuse: Denies  Prior Inpatient Therapy Prior Inpatient Therapy: Yes Prior Therapy Dates: 02/05/12 Prior Therapy Facilty/Provider(s): [facility] Reason for Treatment: DEPRESSED/SUICIDAL  Prior Outpatient Therapy Prior Outpatient Therapy: No Prior Therapy Dates: NA Prior Therapy Facilty/Provider(s): NA  ADL Screening (condition at time of admission) Patient's cognitive ability adequate to safely complete daily activities?: Yes Patient able to express need for assistance with ADLs?: Yes Independently performs ADLs?: Yes (appropriate for developmental age) Weakness of Legs: None Weakness of Arms/Hands: None  Home Assistive Devices/Equipment Home Assistive Devices/Equipment: None  Therapy Consults (therapy consults require a physician order) PT Evaluation Needed: No OT Evalulation Needed: No SLP Evaluation Needed: No Abuse/Neglect Assessment (Assessment to be complete while patient is alone) Physical Abuse: Denies Verbal Abuse: Denies Sexual Abuse: Denies Self-Neglect: Denies Values / Beliefs Cultural Requests During Hospitalization:  None Spiritual Requests During Hospitalization: None Consults Spiritual Care Consult Needed: No Social Work Consult Needed: No Merchant navy officer (For Healthcare) Advance Directive: Patient does not have advance directive;Patient would not like information Pre-existing out of facility DNR order (yellow form or pink MOST form): No Nutrition Screen Diet: Regular Unintentional weight loss greater than 10lbs within the last month: No Problems chewing or swallowing foods and/or liquids: No Home Tube Feeding or Total Parenteral Nutrition (TPN): No Patient appears severely malnourished: No Pregnant or Lactating: No  Additional Information 1:1 In Past 12 Months?: No CIRT Risk: No Elopement Risk: No Does patient have medical clearance?: Yes     Disposition:  Disposition Disposition of Patient: Inpatient treatment program Type of inpatient treatment program: Adult  On Site Evaluation by:   Reviewed with Physician:     Rudi Coco 02/12/2012 3:50 PM

## 2012-02-12 NOTE — ED Notes (Signed)
Pt family member brought two bags of belongings for pt, belonging labels and placed in cabinet. Family member at bedside,

## 2012-02-12 NOTE — ED Notes (Signed)
Called into pt's room pt requesting her inhaler again,. Advised pt that it was too early according to her prn order.  pt has expiratory wheezes noted, Dr. Lynelle Doctor notified, additional orders given

## 2012-02-12 NOTE — ED Notes (Signed)
Pt's family member brought pt clothes, pt clothes were labeled and place in a locker, pt has clothes in a plastic bag and also a pink bag. RN is aware.

## 2012-02-12 NOTE — ED Notes (Signed)
Respiratory contacted; will be down to administer albuterol inhaler.

## 2012-02-12 NOTE — ED Provider Notes (Signed)
1478 Patient with h/o depression and recent hospitalization for same. Here with SI. Await placement . No behavioral issues overnight.  Nicoletta Dress. Colon Branch, MD 02/12/12 [phone]

## 2012-02-12 NOTE — ED Notes (Signed)
Pt upset, states " i need something to help me", ativan 1mg  offered to pt per prn medication orders, pt agrees, comfort measures provided,

## 2012-02-13 ENCOUNTER — Encounter (HOSPITAL_COMMUNITY): Payer: Self-pay | Admitting: *Deleted

## 2012-02-13 ENCOUNTER — Inpatient Hospital Stay (HOSPITAL_COMMUNITY)
Admission: AD | Admit: 2012-02-13 | Discharge: 2012-02-19 | DRG: 885 | Disposition: A | Discharge status: Home / Self Care | Payer: 59 | Source: Ambulatory Visit | Attending: Psychiatry | Admitting: Psychiatry

## 2012-02-13 DIAGNOSIS — F1210 Cannabis abuse, uncomplicated: Secondary | ICD-10-CM | POA: Diagnosis present

## 2012-02-13 DIAGNOSIS — Z5900 Homelessness unspecified: Secondary | ICD-10-CM

## 2012-02-13 DIAGNOSIS — F5105 Insomnia due to other mental disorder: Secondary | ICD-10-CM | POA: Diagnosis present

## 2012-02-13 DIAGNOSIS — F329 Major depressive disorder, single episode, unspecified: Secondary | ICD-10-CM | POA: Diagnosis present

## 2012-02-13 DIAGNOSIS — F332 Major depressive disorder, recurrent severe without psychotic features: Principal | ICD-10-CM | POA: Diagnosis present

## 2012-02-13 DIAGNOSIS — R45851 Suicidal ideations: Secondary | ICD-10-CM

## 2012-02-13 DIAGNOSIS — F32A Depression, unspecified: Secondary | ICD-10-CM

## 2012-02-13 DIAGNOSIS — F489 Nonpsychotic mental disorder, unspecified: Secondary | ICD-10-CM | POA: Diagnosis present

## 2012-02-13 DIAGNOSIS — K5090 Crohn's disease, unspecified, without complications: Secondary | ICD-10-CM | POA: Diagnosis present

## 2012-02-13 MED ORDER — ALBUTEROL SULFATE (5 MG/ML) 0.5% IN NEBU
2.5000 mg | INHALATION_SOLUTION | Freq: Three times a day (TID) | RESPIRATORY_TRACT | Status: DC | PRN
  Administered 2012-02-14: 2.5 mg via RESPIRATORY_TRACT

## 2012-02-13 MED ORDER — BENZONATATE 100 MG PO CAPS
100.0000 mg | ORAL_CAPSULE | Freq: Three times a day (TID) | ORAL | Status: DC
  Administered 2012-02-14 – 2012-02-19 (×18): 100 mg via ORAL
  Filled 2012-02-13 (×23): qty 1

## 2012-02-13 MED ORDER — IBUPROFEN 400 MG PO TABS
ORAL_TABLET | ORAL | Status: AC
  Filled 2012-02-13: qty 2

## 2012-02-13 MED ORDER — GABAPENTIN 100 MG PO CAPS
100.0000 mg | ORAL_CAPSULE | Freq: Two times a day (BID) | ORAL | Status: DC
  Administered 2012-02-14 – 2012-02-17 (×7): 100 mg via ORAL
  Filled 2012-02-13 (×9): qty 1

## 2012-02-13 MED ORDER — IPRATROPIUM BROMIDE 0.02 % IN SOLN
0.5000 mg | Freq: Three times a day (TID) | RESPIRATORY_TRACT | Status: DC | PRN
  Administered 2012-02-14: 0.5 mg via RESPIRATORY_TRACT

## 2012-02-13 MED ORDER — ALBUTEROL SULFATE HFA 108 (90 BASE) MCG/ACT IN AERS
2.0000 | INHALATION_SPRAY | Freq: Four times a day (QID) | RESPIRATORY_TRACT | Status: DC | PRN
  Administered 2012-02-14 – 2012-02-15 (×2): 2 via RESPIRATORY_TRACT
  Filled 2012-02-13: qty 6.7

## 2012-02-13 MED ORDER — CITALOPRAM HYDROBROMIDE 20 MG PO TABS
20.0000 mg | ORAL_TABLET | Freq: Every day | ORAL | Status: DC
  Administered 2012-02-14 – 2012-02-15 (×2): 20 mg via ORAL
  Filled 2012-02-13 (×3): qty 1

## 2012-02-13 MED ORDER — GUAIFENESIN ER 600 MG PO TB12
600.0000 mg | ORAL_TABLET | Freq: Two times a day (BID) | ORAL | Status: DC
  Administered 2012-02-14 – 2012-02-19 (×11): 600 mg via ORAL
  Filled 2012-02-13: qty 28
  Filled 2012-02-13 (×7): qty 1
  Filled 2012-02-13: qty 28
  Filled 2012-02-13: qty 1
  Filled 2012-02-13: qty 28
  Filled 2012-02-13 (×2): qty 1
  Filled 2012-02-13: qty 28
  Filled 2012-02-13 (×4): qty 1

## 2012-02-13 MED ORDER — LEVOFLOXACIN 750 MG PO TABS
750.0000 mg | ORAL_TABLET | Freq: Every day | ORAL | Status: DC
  Administered 2012-02-14 – 2012-02-19 (×6): 750 mg via ORAL
  Filled 2012-02-13 (×8): qty 1

## 2012-02-13 MED ORDER — IBUPROFEN 200 MG PO TABS
200.0000 mg | ORAL_TABLET | Freq: Four times a day (QID) | ORAL | Status: DC | PRN
  Administered 2012-02-14 – 2012-02-15 (×2): 200 mg via ORAL
  Filled 2012-02-13 (×3): qty 1

## 2012-02-13 MED ORDER — PANTOPRAZOLE SODIUM 40 MG PO TBEC
40.0000 mg | DELAYED_RELEASE_TABLET | Freq: Every day | ORAL | Status: DC
  Administered 2012-02-14 – 2012-02-19 (×6): 40 mg via ORAL
  Filled 2012-02-13 (×2): qty 14
  Filled 2012-02-13 (×7): qty 1

## 2012-02-13 MED ORDER — TRAZODONE HCL 100 MG PO TABS
100.0000 mg | ORAL_TABLET | Freq: Every evening | ORAL | Status: DC | PRN
  Administered 2012-02-13 – 2012-02-14 (×2): 100 mg via ORAL
  Filled 2012-02-13 (×2): qty 1

## 2012-02-13 NOTE — ED Notes (Signed)
Pt sleeping at this time, NAD noted, resp even and non labored.  

## 2012-02-13 NOTE — ED Notes (Signed)
Pt has not complained of any SOB and has not asked to use her albuterol inhaler tonight. Pt denies SOB at this time, no wheezing noted.

## 2012-02-13 NOTE — ED Provider Notes (Addendum)
Pt laying on side, sleeping soundly, resps easy, NAD.  Pending admit for +SI.  Laray Anger, DO 02/13/12 [id number]   Pt has been accepted to [facility].  Will transfer, stable.   Laray Anger, DO 02/13/12 1256

## 2012-02-13 NOTE — ED Notes (Signed)
Patient provided lunch tray.

## 2012-02-13 NOTE — ED Notes (Signed)
Patient asking for Ativan, Informed next dose will be at 1730-1800. Patient agreeable to plan.

## 2012-02-13 NOTE — ED Notes (Signed)
Patient's husband called wanting to speak to patient. Patient does not want to speak with him. Message taken from husband and given to patient.

## 2012-02-13 NOTE — ED Notes (Signed)
Pt waiting on [facility], Is aware of transport to [facility]

## 2012-02-13 NOTE — Progress Notes (Signed)
Patient ID: Catherine Costa, female   DOB: 21-Feb-1971, 41 y.o.   MRN: [id number] Pt is a 41 y.o. Female, just released from [facility] on this past week, today expressing increased depression, anxiety and suicidal ideations.  Pt. Also notes that she is having legal issues r/t child custody. Pt. Reports financial issues. Pt. Contracts for safety. Pt. Has med. Hx. Of pneumonia, asthma, chronic bronchitis, and seizures. Pt. Offered food/drink and taken to unit. Pt.oriented to room. Staff will monitor q60min for safety.

## 2012-02-13 NOTE — Progress Notes (Signed)
Report received from S.Johnson RN. Patient currently sitting in her room talking to her roommate awaiting her medications. Patient to receive due medications once orders received. Safety maintained on unit, will continue to monitor.

## 2012-02-13 NOTE — Tx Team (Signed)
Initial Interdisciplinary Treatment Plan  PATIENT STRENGTHS: (choose at least two) Ability for insight Average or above average intelligence Communication skills Religious Affiliation Supportive family/friends  PATIENT STRESSORS: Financial difficulties Health problems Legal issue Marital or family conflict   PROBLEM LIST: Problem List/Patient Goals Date to be addressed Date deferred Reason deferred Estimated date of resolution  Suicidal Ideations 02-13-12     Depression 02-13-12     Asthma 02-13-12                                          DISCHARGE CRITERIA:  Ability to meet basic life and health needs Adequate post-discharge living arrangements Improved stabilization in mood, thinking, and/or behavior Medical problems require only outpatient monitoring Need for constant or close observation no longer present Reduction of life-threatening or endangering symptoms to within safe limits  PRELIMINARY DISCHARGE PLAN: Attend aftercare/continuing care group Outpatient therapy Participate in family therapy Return to previous living arrangement  PATIENT/FAMIILY INVOLVEMENT: This treatment plan has been presented to and reviewed with the patient, Catherine Costa, and/or family member.  The patient and family have been given the opportunity to ask questions and make suggestions.  Mickeal Needy 02/13/2012, 10:14 PM

## 2012-02-13 NOTE — ED Notes (Signed)
Still awaiting transport by [facility].

## 2012-02-13 NOTE — ED Notes (Signed)
Pt complained of 7/10 headache and some slight SOB prior to leaving with [facility]. 600mg  ibuprofin and her albuterol inhaler given prior to her departure with [facility]. Slight expiratory wheezing noted. NAD

## 2012-02-13 NOTE — ED Notes (Signed)
Patient sitting in bed watching TV. NAD noted. Sitter remains at bedside.

## 2012-02-13 NOTE — ED Notes (Signed)
Report given to Lefors, RN [facility]. Ready to receive patient.

## 2012-02-13 NOTE — Progress Notes (Signed)
1610 Patient with SI, depression, awaiting placement. No behavioral issues overnight.

## 2012-02-13 NOTE — BH Assessment (Signed)
Assessment Note   Catherine Costa is an 41 y.o. female. PT REMAINS SUICIDAL STATING "WHAT DO YOU WANT ME TO DO, GO HOME AND BLOW MY BRAINS OUT.     Axis I: Major Depression, Recurrent severe Axis II: Deferred Axis III:  Past Medical History  Diagnosis Date  . Pneumonia   . Depression   . Crohn's disease    Axis IV: economic problems, housing problems, problems related to social environment and problems with primary support group Axis V: 11-20 some danger of hurting self or others possible OR occasionally fails to maintain minimal personal hygiene OR gross impairment in communication  Past Medical History:  Past Medical History  Diagnosis Date  . Pneumonia   . Depression   . Crohn's disease     Past Surgical History  Procedure Date  . Abdominal hysterectomy   . Fracture surgery     Family History: History reviewed. No pertinent family history.  Social History:  reports that she has been smoking.  She does not have any smokeless tobacco history on file. She reports that she uses illicit drugs (Marijuana). She reports that she does not drink alcohol.  Additional Social History:  Alcohol / Drug Use Pain Medications: na Prescriptions: na Over the Counter: na History of alcohol / drug use?: Yes Substance #1 Name of Substance 1: MARIJUANA 1 - Age of First Use: 11 1 - Amount (size/oz): 1 JOINT 1 - Frequency: 1 TIME IN EIGHT YEARS 1 - Duration: 1 NIGHT 1 - Last Use / Amount: 2 WEEKENDS AGO 1 JOINT  CIWA: CIWA-Ar BP: 117/89 mmHg Pulse Rate: 73  COWS:    Allergies:  Allergies  Allergen Reactions  . Bee Venom Anaphylaxis  . Sulfa Antibiotics Other (See Comments)    REACTION: Affects Colon  . Voltaren (Diclofenac Sodium) Other (See Comments)    REACTION: Affects Colon    Home Medications:  (Not in a hospital admission)  OB/GYN Status:  No LMP recorded. Patient has had a hysterectomy.  General Assessment Data Location of Assessment: [facility] ACT Assessment:  Yes Living Arrangements: Other relatives Can pt return to current living arrangement?: Yes Admission Status: Voluntary Is patient capable of signing voluntary admission?: Yes Transfer from: Acute Hospital Referral Source: MD  Education Status Contact person: Catherine Costa  Risk to self Suicidal Ideation: Yes-Currently Present Suicidal Intent: Yes-Currently Present Is patient at risk for suicide?: Yes Suicidal Plan?: Yes-Currently Present Specify Current Suicidal Plan:  (Shoot self) Access to Means: Yes Specify Access to Suicidal Means:  (Guns) What has been your use of drugs/alcohol within the last 12 months?:  (THC) Previous Attempts/Gestures: No How many times?: 1  (ONE UNREPORTED OVERDOSE 1 YEAR AGO ; 1 THREAT) Other Self Harm Risks: NA Triggers for Past Attempts: Family contact;Spouse contact;Other (Comment) Intentional Self Injurious Behavior: None Family Suicide History: No Recent stressful life event(s): Conflict (Comment) Persecutory voices/beliefs?: No Depression: Yes Depression Symptoms: Despondent;Feeling angry/irritable;Feeling worthless/self pity;Loss of interest in usual pleasures;Tearfulness Substance abuse history and/or treatment for substance abuse?: Yes Suicide prevention information given to non-admitted patients: Not applicable  Risk to Others Homicidal Ideation: No Thoughts of Harm to Others: No Current Homicidal Intent: No Current Homicidal Plan: No Access to Homicidal Means: No History of harm to others?: No Assessment of Violence: None Noted Violent Behavior Description: NA Does patient have access to weapons?: Yes (Comment) (4 guns) Criminal Charges Pending?: No Does patient have a court date: No  Psychosis Hallucinations: None noted Delusions: None noted  Mental Status  Report Appear/Hygiene: Disheveled;Poor hygiene Eye Contact: Good Motor Activity: Freedom of movement Speech: Logical/coherent Level of  Consciousness: Alert;Crying Mood: Depressed;Worthless, low self-esteem;Helpless;Sad;Irritable Affect: Sad;Depressed Anxiety Level: Minimal Thought Processes: Coherent Judgement: Impaired Orientation: Person;Place;Time;Situation Obsessive Compulsive Thoughts/Behaviors: None  Cognitive Functioning Concentration: Normal Memory: Recent Intact;Remote Intact IQ: Average Insight: Poor Impulse Control: Poor Appetite: Fair Sleep: No Change Total Hours of Sleep:  (4) Vegetative Symptoms: None  ADLScreening [facility] Assessment Services) Patient's cognitive ability adequate to safely complete daily activities?: Yes Patient able to express need for assistance with ADLs?: Yes Independently performs ADLs?: Yes (appropriate for developmental age)  Abuse/Neglect [facility]) Physical Abuse: Denies Verbal Abuse: Denies Sexual Abuse: Denies  Prior Inpatient Therapy Prior Inpatient Therapy: Yes Prior Therapy Dates: 02/05/12 Prior Therapy Facilty/Provider(s): [facility] Reason for Treatment: DEPRESSED/SUICIDAL  Prior Outpatient Therapy Prior Outpatient Therapy: No Prior Therapy Dates: NA Prior Therapy Facilty/Provider(s): NA  ADL Screening (condition at time of admission) Patient's cognitive ability adequate to safely complete daily activities?: Yes Patient able to express need for assistance with ADLs?: Yes Independently performs ADLs?: Yes (appropriate for developmental age) Weakness of Legs: None Weakness of Arms/Hands: None  Home Assistive Devices/Equipment Home Assistive Devices/Equipment: None  Therapy Consults (therapy consults require a physician order) PT Evaluation Needed: No OT Evalulation Needed: No SLP Evaluation Needed: No Abuse/Neglect Assessment (Assessment to be complete while patient is alone) Physical Abuse: Denies Verbal Abuse: Denies Sexual Abuse: Denies Self-Neglect: Denies Values / Beliefs Cultural Requests During Hospitalization: None Spiritual Requests During  Hospitalization: None Consults Spiritual Care Consult Needed: No Social Work Consult Needed: No Merchant navy officer (For Healthcare) Advance Directive: Patient does not have advance directive;Patient would not like information Pre-existing out of facility DNR order (yellow form or pink MOST form): No Nutrition Screen Diet: Regular Unintentional weight loss greater than 10lbs within the last month: No Problems chewing or swallowing foods and/or liquids: No Home Tube Feeding or Total Parenteral Nutrition (TPN): No Patient appears severely malnourished: No Pregnant or Lactating: No  Additional Information 1:1 In Past 12 Months?: No CIRT Risk: No Elopement Risk: No Does patient have medical clearance?: Yes     Disposition: PT ACCEPTED TO [facility] BY DR PUTHEVUL TO DR Dan Humphreys ROOM 501-1    Disposition Disposition of Patient: Inpatient treatment program Type of inpatient treatment program: Adult (accepted to [facility])  On Site Evaluation by:   Reviewed with Physician:  DR Samuel Jester   Hattie Perch Winford 02/13/2012 1:46 PM

## 2012-02-14 MED ORDER — NICOTINE 21 MG/24HR TD PT24
MEDICATED_PATCH | TRANSDERMAL | Status: AC
  Administered 2012-02-14: 10:00:00
  Filled 2012-02-14: qty 1

## 2012-02-14 NOTE — [facility] Suicide Risk Assessment (Signed)
Suicide Risk Assessment  Admission Assessment     Demographic factors: Assessment Details  Time of Assessment: Admission  Information Obtained From: Patient  Current Mental Status: see below Loss Factors: unemployed  Historical Factors: Historical Factors: Prior suicide attempts  Risk Reduction Factors: Risk Reduction Factors: Responsible for children under 43 years of age;Sense of responsibility to family;Religious beliefs about death;Living with another person, especially a relative;Positive social support  CLINICAL FACTORS:  Depression: Hopelessness  COGNITIVE FEATURES THAT CONTRIBUTE TO RISK:  Loss of executive function  SUICIDE RISK:  Moderate: Frequent suicidal ideation with limited intensity, and duration, some specificity in terms of plans, no associated intent, good self-control, limited dysphoria/symptomatology, some risk factors present, and identifiable protective factors, including available and accessible social support.  PLAN OF CARE:  Mental Status Examination/Evaluation:  Objective: Appearance: Fairly Groomed   Psychomotor Activity: Somewhat restless due to cough causing respiratory distress   Eye Contact: Good   Speech: Clear and Coherent and Normal Rate   Volume: Normal   Mood:not good   Affect: ristricted   Thought Process: clear rational goal oriented   Orientation: Full   Thought Content: No AVH/psychosis   Suicidal Thoughts: yes  Homicidal Thoughts: No   Judgement: poor   Insight: poor   DIAGNOSIS:  AXIS I  Depressive Disorder nos, cannabis abuse   AXIS II  Deferred   AXIS III  See medical history.  Needs a mammogram hasn't had first yet   AXIS IV  economic problems, educational problems, housing problems, occupational problems, other psychosocial or environmental problems and problems with primary support group   AXIS V  35   Treatment Plan Summary:   Admit for safety & stabilization  Adjust meds as indicated  Counselor to explore community supports      Wonda Cerise 02/14/2012, 11:37 AM

## 2012-02-14 NOTE — Progress Notes (Signed)
Patient has been up and active on the unit, reports that she finally got her clothing from the ED today all except for her underwear. Patient attended wrap up group and before receiving her hs medications she had been on the phone and was tearful because her son has gotten in trouble and has a court date coming up. Writer encouraged patient to focus on trying to get herself well and hopefully this will be a learning experience for her 42 yr old son. Patient requested a nebulizer tx due to her coughing from being upset. Treatment given and patient observed sitting in her room reading and calmer. Support and encouragement offered. Safety maintained on unit, will continue to monitor.
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
## 2012-02-14 NOTE — H&P (Signed)
Psychiatric Admission Assessment Adult  Patient Identification:  Catherine Costa Date of Evaluation:  02/14/2012 Chief Complaint:  MDD REC SEV History of Present Illness::Sirenia returned to the ED after being discharged form [facility] on 02/09/2012 with thoughts of suicide.  She is undergoing a great deal of stress due to her recent divorce and her 43 yr old daughter getting ready to under go treatment for thyroid cancer. She currently lives with her 66 yr old daughter and grandchild with her 49 yr old son.  There is a lot of drama in the family.  She was unable to continue her medications due to finances. Past Psychiatric History: Diagnosis:  MDD recurrent  Hospitalizations: 1 at [facility]  Outpatient Care:  Not yet  Substance Abuse Care:  Self-Mutilation:  Suicidal Attempts: 1 previously  Violent Behaviors:   Past Medical History:   Past Medical History  Diagnosis Date  . Pneumonia   . Depression   . Crohn's disease     Allergies:   Allergies  Allergen Reactions  . Bee Venom Anaphylaxis  . Sulfa Antibiotics Other (See Comments)    REACTION: Affects Colon  . Voltaren (Diclofenac Sodium) Other (See Comments)    REACTION: Affects Colon   PTA Medications: Prescriptions prior to admission  Medication Sig Dispense Refill  . albuterol (PROAIR HFA) 108 (90 BASE) MCG/ACT inhaler Inhale 2 puffs into the lungs every 6 (six) hours as needed for wheezing or shortness of breath.  3.7 g  0  . albuterol (PROVENTIL) (5 MG/ML) 0.5% nebulizer solution Take 0.5 mLs (2.5 mg total) by nebulization 3 (three) times daily as needed for wheezing or shortness of breath.  20 mL  0  . benzonatate (TESSALON) 100 MG capsule Take 1 capsule (100 mg total) by mouth 3 (three) times daily. For cough  20 capsule  0  . citalopram (CELEXA) 20 MG tablet Take 1 tablet (20 mg total) by mouth daily. For depression.  30 tablet  0  . gabapentin (NEURONTIN) 100 MG capsule Take 1 capsule (100 mg total) by mouth 2 (two) times daily. For  anxiety  60 capsule  0  . guaiFENesin (MUCINEX) 600 MG 12 hr tablet Take 1 tablet (600 mg total) by mouth 2 (two) times daily as needed for congestion.  60 tablet  0  . ibuprofen (ADVIL,MOTRIN) 200 MG tablet Take 4 tablets (800 mg total) by mouth every 6 (six) hours as needed for pain or headache.  30 tablet  0  . ipratropium (ATROVENT) 0.02 % nebulizer solution Take 2.5 mLs (0.5 mg total) by nebulization 3 (three) times daily as needed for wheezing.  75 mL  0  . levofloxacin (LEVAQUIN) 750 MG tablet Take 1 tablet (750 mg total) by mouth daily. For pneumonia  9 tablet  0  . omeprazole (PRILOSEC) 20 MG capsule Take 1 capsule (20 mg total) by mouth 2 (two) times daily. For control of stomach acid secretion and helps GERD.  60 capsule  0  . traZODone (DESYREL) 100 MG tablet Take 1 tablet (100 mg total) by mouth at bedtime as needed (insomnia).  45 tablet  0    Previous Psychotropic Medications:  See notes                   Substance Abuse History in the last 12 months: Substance Age of 1st Use Last Use Amount Specific Type  Nicotine      Alcohol      Cannabis    2 joints  Opiates      Cocaine      Methamphetamines      LSD      Ecstasy      Benzodiazepines      Caffeine      Inhalants      Others:                         Consequences of Substance Abuse:   Social History: Current Place of Residence:   Place of Birth:   Family Members: Marital Status:  Divorced Children:  Sons:  Daughters: Relationships: Education:   Educational Problems/Performance: Religious Beliefs/Practices: History of Abuse (Emotional/Phsycial/Sexual) Occupational Experiences; Military History:   Legal History: Hobbies/Interests:  Family History:  History reviewed. No pertinent family history. ROS: Negative with the exception of the HPI. PE: Completed in the ED by MD. Mental Status Examination/Evaluation: Objective:  Appearance: Disheveled  Eye Contact::  Good  Speech:  Clear and  Coherent  Volume:  Normal  Mood:  Depressed and Worthless  Affect:  Congruent  Thought Process:  Coherent  Orientation:  Full  Thought Content:  WDL  Suicidal Thoughts:  Yes.  without intent/plan  Homicidal Thoughts:  No  Memory:  Immediate;   Fair  Judgement:  Fair  Insight:  Fair  Psychomotor Activity:  Normal  Concentration:  Fair  Recall:  Fair  Akathisia:  No  Handed:  Right  AIMS (if indicated):     Assets:  Communication Skills Desire for Improvement  Sleep:  Number of Hours: 4.75     Laboratory/X-Ray Psychological Evaluation(s)      Assessment:    AXIS I:  MDD severe recurrent AXIS II:  Deferred AXIS III:   Past Medical History  Diagnosis Date  . Pneumonia   . Depression   . Crohn's disease    AXIS IV:  educational problems, occupational problems, problems related to social environment and problems with access to health care services AXIS V:  51-60 moderate symptoms  Treatment Plan/Recommendations:  Treatment Plan Summary: 1. Admit for crisis management and stabilization. 2. Medication management to reduce current symptoms to base line and improve the     patient's overall level of functioning 3. Treat health problems as indicated. 4. Develop treatment plan to decrease risk of relapse upon discharge and the need for     readmission. 5. Psycho-social education regarding relapse prevention and self care. 6. Health care follow up as needed for medical problems. 7. Restart home medications where appropriate.  Current Medications:  Current Facility-Administered Medications  Medication Dose Route Frequency Provider Last Rate Last Dose  . albuterol (PROVENTIL HFA;VENTOLIN HFA) 108 (90 BASE) MCG/ACT inhaler 2 puff  2 puff Inhalation Q6H PRN Cleotis Nipper, MD      . albuterol (PROVENTIL) (5 MG/ML) 0.5% nebulizer solution 2.5 mg  2.5 mg Nebulization TID PRN Cleotis Nipper, MD      . benzonatate (TESSALON) capsule 100 mg  100 mg Oral TID Cleotis Nipper, MD      .  citalopram (CELEXA) tablet 20 mg  20 mg Oral Daily Cleotis Nipper, MD      . gabapentin (NEURONTIN) capsule 100 mg  100 mg Oral BID Cleotis Nipper, MD      . guaiFENesin (MUCINEX) 12 hr tablet 600 mg  600 mg Oral BID Cleotis Nipper, MD      . ibuprofen (ADVIL,MOTRIN) tablet 200 mg  200 mg Oral Q6H PRN Cleotis Nipper, MD      .  ipratropium (ATROVENT) nebulizer solution 0.5 mg  0.5 mg Nebulization TID PRN Cleotis Nipper, MD      . levofloxacin (LEVAQUIN) tablet 750 mg  750 mg Oral Daily Cleotis Nipper, MD      . pantoprazole (PROTONIX) EC tablet 40 mg  40 mg Oral Q1200 Cleotis Nipper, MD      . traZODone (DESYREL) tablet 100 mg  100 mg Oral QHS PRN Cleotis Nipper, MD   100 mg at 02/13/12 2352   Facility-Administered Medications Ordered in Other Encounters  Medication Dose Route Frequency Provider Last Rate Last Dose  . DISCONTD: acetaminophen (TYLENOL) tablet 650 mg  650 mg Oral Q4H PRN [name], MD      . DISCONTD: albuterol (PROVENTIL HFA;VENTOLIN HFA) 108 (90 BASE) MCG/ACT inhaler 2 puff  2 puff Inhalation Q6H PRN [name], MD   2 puff at 02/13/12 2024  . DISCONTD: alum & mag hydroxide-simeth (MAALOX/MYLANTA) 200-200-20 MG/5ML suspension 30 mL  30 mL Oral PRN [name], MD      . DISCONTD: citalopram (CELEXA) tablet 20 mg  20 mg Oral Daily [name], MD   20 mg at 02/13/12 1002  . DISCONTD: fluconazole (DIFLUCAN) tablet 200 mg  200 mg Oral Daily Benny Lennert, MD   200 mg at 02/13/12 1002  . DISCONTD: gabapentin (NEURONTIN) capsule 100 mg  100 mg Oral BID [name], MD   100 mg at 02/13/12 1003  . DISCONTD: guaiFENesin (MUCINEX) 12 hr tablet 600 mg  600 mg Oral BID PRN [name], MD      . DISCONTD: ibuprofen (ADVIL,MOTRIN) 400 MG tablet           . DISCONTD: ibuprofen (ADVIL,MOTRIN) tablet 600 mg  600 mg Oral Q8H PRN [name], MD   600 mg at 02/13/12 2022  . DISCONTD: levofloxacin (LEVAQUIN) tablet 750 mg  750 mg Oral Daily [name], MD   750 mg at 02/13/12 1002  . DISCONTD:  LORazepam (ATIVAN) tablet 1 mg  1 mg Oral Q8H PRN [name], MD   1 mg at 02/13/12 1805  . DISCONTD: nicotine (NICODERM CQ - dosed in mg/24 hours) patch 21 mg  21 mg Transdermal Daily [name], MD   21 mg at 02/13/12 1004  . DISCONTD: ondansetron (ZOFRAN) tablet 4 mg  4 mg Oral Q8H PRN [name], MD      . DISCONTD: pantoprazole (PROTONIX) EC tablet 40 mg  40 mg Oral Q1200 [name], MD   40 mg at 02/13/12 1232  . DISCONTD: traZODone (DESYREL) tablet 100 mg  100 mg Oral QHS PRN [name], MD   100 mg at 02/12/12 2234  . DISCONTD: zolpidem (AMBIEN) tablet 10 mg  10 mg Oral QHS PRN [name], MD        Observation Level/Precautions:  routine  Laboratory:    Psychotherapy:    Medications:    Routine PRN Medications:  Yes  Consultations:    Discharge Concerns:    Other:     Laurin Paulo 8/17/20139:04 AM

## 2012-02-14 NOTE — Progress Notes (Signed)
Psychoeducational Group Note  Date:  02/14/2012 Time: 1015  Group Topic/Focus:  Identifying Needs:   The focus of this group is to help patients identify their personal needs that have been historically problematic and identify healthy behaviors to address their needs.  Participation Level:  Active  Participation Quality:  Appropriate  Affect:  Depressed  Cognitive:  Alert  Insight:  Good  Engagement in Group:  Limited  Additional Comments:    02/14/2012,2:09 PM Kimmberly Wisser, Joie Bimler

## 2012-02-14 NOTE — Progress Notes (Signed)
[facility] Group Notes:  (Counselor/Nursing/MHT/Case Management/Adjunct)  02/14/2012 4:05 PM  Type of Therapy:  Group Therapy  Participation Level:  Active  Participation Quality:  Appropriate and Attentive  Affect:  Appropriate  Cognitive:  Appropriate  Insight:  Good  Engagement in Group:  Good  Engagement in Therapy:  Good  Modes of Intervention:  Activity, Clarification, Socialization and Support  Summary of Progress/Problems: Pt. participated in group on self sabotaging behaviors and who to change the way they think and how to be positive verses negative in thoughts and ideas about everyday  Things in their lives. Each pt. Was encouraged to say or write positive affirmations or things that they like about themselves. Pt. Spoke about her self sabotaging behavior  Of not being able to set boundaries with others ad about how she is a care giver to others. The pt. Spoke about taking time for her self and developing boundaries.  Catherine Costa 02/14/2012, 4:05 PM

## 2012-02-14 NOTE — Progress Notes (Signed)
Psychoeducational Group Note  Date:  02/14/2012 Time:  2000  Group Topic/Focus:  Wrap-Up Group:   The focus of this group is to help patients review their daily goal of treatment and discuss progress on daily workbooks.  Participation Level:  Active  Participation Quality:  Appropriate  Affect:  Appropriate  Cognitive:  Alert  Insight:  Good  Engagement in Group:  Good  Additional Comments:  Pt said she will learn to say no some time  Graceann Congress [place] 02/14/2012, 10:00 PM

## 2012-02-14 NOTE — Progress Notes (Signed)
Psychoeducational Group Note  Date:  02/14/2012 Time:  1515  Group Topic/Focus:  Developing a Wellness Toolbox:   The focus of this group is to help patients develop a "wellness toolbox" with skills and strategies to promote recovery upon discharge.  Participation Level:    Participation Quality:    Affect:    Cognitive:    Insight:    Engagement in Group:    Additional Comments:  Due to meds  Catherine Costa 02/14/2012, 4:39 PM

## 2012-02-14 NOTE — Progress Notes (Signed)
D Flossie is seen OOB UAL on the 500 hall today...tolerated fair. She is acutely sad....she does make eye contact, but shows no emotion....looks away and does not want to [facility] eye contact.She completed her self inventory and on it she wrote she denied SI, she rated her depression and hopelessnes " 8 / 8 " and stated her DC plan was to " get several people out of my life".               A SHe is receptive to material on healthy choice-making in Life SKills group this AM.                R Safety is in place, POC inludes continuing to f/u on obtaining her personal belongings that were left at previous hospital she was in. PD RN [facility]

## 2012-02-14 NOTE — H&P (Signed)
  Pt was seen by me today. Will continue current meds.  The detailed H&P note will be done by mid level (NP/PA).  

## 2012-02-15 MED ORDER — NICOTINE 21 MG/24HR TD PT24
21.0000 mg | MEDICATED_PATCH | Freq: Every day | TRANSDERMAL | Status: DC
  Administered 2012-02-15 – 2012-02-19 (×5): 21 mg via TRANSDERMAL
  Filled 2012-02-15 (×7): qty 1

## 2012-02-15 MED ORDER — FLUCONAZOLE 150 MG PO TABS
150.0000 mg | ORAL_TABLET | Freq: Every day | ORAL | Status: AC
  Administered 2012-02-15 – 2012-02-17 (×3): 150 mg via ORAL
  Filled 2012-02-15 (×3): qty 1

## 2012-02-15 MED ORDER — HYDROXYZINE HCL 25 MG PO TABS
25.0000 mg | ORAL_TABLET | Freq: Three times a day (TID) | ORAL | Status: DC | PRN
  Administered 2012-02-15 – 2012-02-17 (×5): 25 mg via ORAL
  Filled 2012-02-15: qty 42

## 2012-02-15 MED ORDER — NICOTINE 21 MG/24HR TD PT24
MEDICATED_PATCH | TRANSDERMAL | Status: AC
  Administered 2012-02-15: 09:00:00
  Filled 2012-02-15: qty 1

## 2012-02-15 MED ORDER — CITALOPRAM HYDROBROMIDE 40 MG PO TABS
40.0000 mg | ORAL_TABLET | Freq: Every day | ORAL | Status: DC
  Administered 2012-02-16 – 2012-02-19 (×4): 40 mg via ORAL
  Filled 2012-02-15: qty 14
  Filled 2012-02-15 (×3): qty 1
  Filled 2012-02-15: qty 14
  Filled 2012-02-15 (×2): qty 1

## 2012-02-15 MED ORDER — TRAZODONE HCL 150 MG PO TABS
150.0000 mg | ORAL_TABLET | Freq: Every day | ORAL | Status: DC
  Administered 2012-02-15: 150 mg via ORAL
  Filled 2012-02-15: qty 1
  Filled 2012-02-15: qty 2
  Filled 2012-02-15 (×2): qty 1

## 2012-02-15 NOTE — Progress Notes (Signed)
Writer spoke with patient before she attended wrap up group. Patient reports that she feel a little better and inquired as to if her trazadone had been increased since she did not rest well last night. Patient informed of her medications changes and she was pleased. Reports that her cough is getting better and is productive, phlegm clearing up some but still some gray tinge noted. Patient denies si/hi/a/v hall. Support and encouragement offered, safety maintained on unit, will continue to monitor.

## 2012-02-15 NOTE — Progress Notes (Signed)
[facility] Adult Inpatient Family/Significant Other Suicide Prevention Education  Suicide Prevention Education:  Education Completed; [place] Pyrtle-[phone]-Pt.'s daughter- has been identified by the patient as the family member/significant other with whom the patient will be residing, and identified as the person(s) who will aid the patient in the event of a mental health crisis (suicidal ideations/suicide attempt).  With written consent from the patient, the family member/significant other has been provided the following suicide prevention education, prior to the and/or following the discharge of the patient.  The suicide prevention education provided includes the following:  Suicide risk factors  Suicide prevention and interventions  National Suicide Hotline telephone number  [facility] assessment telephone number  [facility] Emergency Assistance 911  [facility] and/or Residential Mobile Crisis Unit telephone number  Request made of family/significant other to:  Remove weapons (e.g., guns, rifles, knives), all items previously/currently identified as safety concern.   Pt.'s daughter states the pt. Does not have access to guns or weapons and none are in the home.   Remove drugs/medications (over-the-counter, prescriptions, illicit drugs), all items previously/currently identified as a safety concern.Pt.'s daughter had no concerns about this and will secure the home before the pt. Is d/c next week.  Pt.'s daughter had no question or concerns. She states pt. Has had no prior history with SI attempts but states that  Pt. Has had past problems  With SI thoughts. Pt.'s daughter was given [place] SI pamphlet information and crisis numbers and agreed to use them if needed.  The family member/significant other verbalizes understanding of the suicide prevention education information provided.  The family member/significant other agrees to remove the items of safety concern  listed above.  Lamar Blinks [place] 02/15/2012, 3:32 PM

## 2012-02-15 NOTE — Progress Notes (Signed)
[facility] Group Notes:  (Counselor/Nursing/MHT/Case Management/Adjunct)  02/15/2012 7:25 PM  Type of Therapy:  Group Therapy  Participation Level:  Active  Participation Quality:  Appropriate and Attentive  Affect:  Appropriate  Cognitive:  Appropriate  Insight:  Good  Engagement in Group:  Good  Engagement in Therapy:  Good  Modes of Intervention:  Clarification, Education, Socialization and Support  Summary of Progress/Problems: Pt. participated in group on finding and having healthy supports systems in their lives after they have been discharged. Each pt. Stated what support meant to them and stated who a support was in their life. Patients who did not have may supports were encouraged to find healthy supports through local supports groups and  Places within there communities. Each pt. participated in activity on what it means to feel support verses having someone just tell you that they will be a support to you. Pt. Spoke about her daughter ,father and friends being a support. Pt. Stated that support means someone who will love and understand you.   Catherine Costa [place] 02/15/2012, 7:25 PM

## 2012-02-15 NOTE — Progress Notes (Signed)
Psychoeducational Group Note  Date:  02/15/2012 Time:  1515  Group Topic/Focus:  Self Care:   The focus of this group is to help patients understand the importance of self-care in order to improve or restore emotional, physical, spiritual, interpersonal, and financial health.  Participation Level:  Active  Participation Quality:  Appropriate  Affect:  Appropriate  Cognitive:  Oriented  Insight:  Good  Engagement in Group:  Good  Additional Comments:  Pt said it is hard to forgive someone who hurt you all your life  Graceann Congress [place] 02/15/2012, 6:54 PM

## 2012-02-15 NOTE — Progress Notes (Signed)
Patient ID: Catherine Costa, female   DOB: [birth year], 41 y.o.   MRN: [id number]  reports more anxiety, poor sleep and vaginal fungal infection now due to antibiotics.   Mental Status Examination/Evaluation:  Objective: Appearance: Fairly Groomed   Psychomotor Activity: Somewhat restless due to cough causing respiratory distress   Eye Contact: Good   Speech: Clear and Coherent and Normal Rate   Volume: Normal   Mood:not good   Affect: ristricted   Thought Process: clear rational goal oriented   Orientation: Full   Thought Content: No AVH/psychosis   Suicidal Thoughts: no  Homicidal Thoughts: No   Judgement: poor   Insight: poor   DIAGNOSIS:  AXIS I  Depressive Disorder nos, cannabis abuse   AXIS II  Deferred   AXIS III  See medical history.  Needs a mammogram hasn't had first yet   AXIS IV  economic problems, educational problems, housing problems, occupational problems, other psychosocial or environmental problems and problems with primary support group   AXIS V  35   Treatment Plan Summary:    - increase trazodone to 150 mg qhs for sleep - vistaril prn for anxiety - start po antifungal today

## 2012-02-15 NOTE — Progress Notes (Signed)
[facility] Group Notes:  (Counselor/Nursing/MHT/Case Management/Adjunct)  02/15/2012 7:03 PM  Type of Therapy:  After Care Planning group  Pt. participated in after care planning group  And was given [place] SI pamphlet and crisis hot line numbers. The pt. Agreed to use them if needed. Pt. Was also given information about the [employer] and a list of free support groups located in [facility].  Each pt. Was encouraged to attend support groups out side of the hospital. The them book was introduced on Health support systems this morning in group and each pt. Was encourage to reach out and utilize their supports in times of crisis as well as becoming self supporting for themselves. Pt. Stated she okay but sleepy from new medication . Pt. Mentioned she had "bad" dreams last night and pt. Was encourage to speak with the doctor/Pa about this today. The pt. Agreed to Pt. denied SI/HI today.  Lamar Blinks [place] 02/15/2012, 7:03 PM

## 2012-02-15 NOTE — Progress Notes (Signed)
[facility] Group Notes:  (Counselor/Nursing/MHT/Case Management/Adjunct)  02/15/2012 11:25 PM  Type of Therapy:  Psychoeducational Skills  Participation Level:  Active  Participation Quality:  Appropriate  Affect:  Appropriate  Cognitive:  Appropriate  Insight:  Good  Engagement in Group:  Good  Engagement in Therapy:  Good  Modes of Intervention:  Education  Summary of Progress/Problems:The pt. stated that she had a decent day today since she was able to exercise in the gym.  In addition, she was able to speak with her son over the telephone about his court hearing. The patient'Costa support system is her daughter and father.   Catherine Costa 02/15/2012, 11:25 PM

## 2012-02-15 NOTE — Progress Notes (Signed)
Catherine Costa cont to get better each day. This AM, she is dressed appropriately, she makes strong eye contact, she takes her morning meds and completes her self invnetory and on it she writes she denies SI, she rates her depression and hopelessness " 4/3" and states her DC plan is to " find a job, my own home, and go to mtgs with my son".             A Her affect is MUCH less depressed and flat. She is seen interacting with the other pts appropriately.             R Safety is in place and POC includes continuing to foster therapeutic relationship PD RN [facility]

## 2012-02-16 DIAGNOSIS — F1210 Cannabis abuse, uncomplicated: Secondary | ICD-10-CM

## 2012-02-16 DIAGNOSIS — F332 Major depressive disorder, recurrent severe without psychotic features: Principal | ICD-10-CM

## 2012-02-16 DIAGNOSIS — F489 Nonpsychotic mental disorder, unspecified: Secondary | ICD-10-CM

## 2012-02-16 MED ORDER — HYDROXYZINE HCL 50 MG PO TABS
50.0000 mg | ORAL_TABLET | Freq: Every evening | ORAL | Status: DC | PRN
  Administered 2012-02-16 – 2012-02-18 (×4): 50 mg via ORAL
  Filled 2012-02-16 (×10): qty 1

## 2012-02-16 MED ORDER — TRAZODONE HCL 100 MG PO TABS
200.0000 mg | ORAL_TABLET | Freq: Every day | ORAL | Status: DC
  Administered 2012-02-16 – 2012-02-18 (×3): 200 mg via ORAL
  Filled 2012-02-16: qty 2
  Filled 2012-02-16: qty 1
  Filled 2012-02-16: qty 28
  Filled 2012-02-16 (×2): qty 2
  Filled 2012-02-16: qty 28
  Filled 2012-02-16: qty 2

## 2012-02-16 MED ORDER — POLYETHYLENE GLYCOL 3350 17 G PO PACK
17.0000 g | PACK | Freq: Every day | ORAL | Status: DC
  Administered 2012-02-16 – 2012-02-19 (×4): 17 g via ORAL
  Filled 2012-02-16 (×7): qty 1

## 2012-02-16 MED ORDER — PROPRANOLOL HCL 10 MG PO TABS
10.0000 mg | ORAL_TABLET | Freq: Three times a day (TID) | ORAL | Status: DC
  Administered 2012-02-16 – 2012-02-19 (×9): 10 mg via ORAL
  Filled 2012-02-16 (×13): qty 1
  Filled 2012-02-16 (×3): qty 42

## 2012-02-16 NOTE — Progress Notes (Signed)
Pt attended discharge planning group and actively participated in group.  SW provided pt with today's workbook.  Pt presents with flat affect and depressed mood.  Pt rates depression at a 4 and anxiety at an 8 today.  Pt states that she came to the hospital due to depression, anxiety and suicide attempt.  Pt states that she has a lot of stress and felt hopeless.  Pt states that she is in a custody battle with her ex husband over their 43 year old son.  Pt states that he owes her $15,000 for child support.  Pt states that she recently lost her job and car.  Pt states that her current husband is an alcoholic.  Pt states that she lives in [place] with her husband and son and has transportation home.  Pt states that she doesn't have a psychiatrist or therapist.  SW will assess for appropriate referrals.  No further needs voiced by pt at this time.  Safety planning and suicide prevention discussed.  Pt participated in discussion and acknowledged an understanding of the information provided.       Reyes Ivan, LCSWA 02/16/2012  10:35 AM

## 2012-02-16 NOTE — Progress Notes (Signed)
[facility] Group Notes: (Counselor/Nursing/MHT/Case Management/Adjunct) 02/16/2012   @  1:15-2:30pm Overcoming Obstacles to Wellness   Type of Therapy:  Group Therapy  Participation Level:  Active  Participation Quality: Appropriate, Sharing, Supportive   Affect:  Appropriate  Cognitive:  Appropriate  Insight:  Good  Engagement in Group: Good  Engagement in Therapy:  Good  Modes of Intervention:  Support and Exploration  Summary of Progress/Problems: Loralye was very engaged. She explored various areas of wellness that she believes she lacks in, focusing most on social and financial. She stated that the more she thinks about things, it always comes back to finances, but she recognizes that she can focus more on improving her wellness in other areas that she has more control of. Kylan processed her tendency to get into bad relationships, stating that she always want to fix people and "has an affinity for strays". She explored the similarity in her own behaviors to those of her grandmother, who is always helping others but who holds their debt over their heads. Maeby stated that she doesn't expect others to "pay back" her kindness, but she did think those people would be there for her when she needed help. She also explored the treatment she receivied from mother and grandmother, who she reports bring things up from the past to exercise control over her through guilt. She was able to recognize how this correlates with unmet needs in her life and to identify ways she has attempted to meet those needs through other people.   Angus Palms, LCSW 02/16/2012  2:35 PM

## 2012-02-16 NOTE — Progress Notes (Signed)
Psychoeducational Group Note  Date:  02/16/2012 Time: 2015  Group Topic/Focus:  Wrap-Up Group:   The focus of this group is to help patients review their daily goal of treatment and discuss progress on daily workbooks.  Participation Level:  Active  Participation Quality:  Appropriate, Attentive, Sharing and Supportive  Affect:  Appropriate  Cognitive:  Alert  Insight:  Good  Engagement in Group:  Good  Additional Comments:  Patient attended and participated in wrap-up group this evening. Patient shared that she was trying to have a good attitude today.  Auden Tatar, [place] Pigg 02/16/2012, 9:02 PM

## 2012-02-16 NOTE — Progress Notes (Signed)
Date: 02/16/2012        Time: 1145       Group Topic/Focus: Patient invited to participate in animal assisted therapy. Pets as a coping skill and responsibility were discussed.  Participation Level: Active  Participation Quality: Appropriate and Attentive  Affect: Appropriate  Cognitive: Appropriate and Oriented   Additional Comments: None.  Catherine Costa 02/16/2012 12:30 PM

## 2012-02-16 NOTE — Progress Notes (Signed)
Psychoeducational Group Note  Date:  02/16/2012 Time:  1100  Group Topic/Focus:  Self Care:   The focus of this group is to help patients understand the importance of self-care in order to improve or restore emotional, physical, spiritual, interpersonal, and financial health.  Participation Level: Did Not Attend  Participation Quality:  Not Applicable  Affect:  Not Applicable  Cognitive:  Not Applicable  Insight:  Not Applicable  Engagement in Group: Not Applicable  Additional Comments:  Pt did not attend group. Pt attended treatment team.   Sharyn Lull 02/16/2012, 11:52 AM

## 2012-02-16 NOTE — Tx Team (Signed)
Interdisciplinary Treatment Plan Update (Adult)  Date:  02/16/2012  Time Reviewed:  10:43 AM   Progress in Treatment: Attending groups: Yes Participating in groups:  Yes Taking medication as prescribed: Yes Tolerating medication:  Yes Family/Significant other contact made: Counselor assessing for appropriate contact  Patient understands diagnosis:  Yes Discussing patient identified problems/goals with staff:  Yes Medical problems stabilized or resolved:  Yes Denies suicidal/homicidal ideation: Yes Issues/concerns per patient self-inventory:  None identified Other: N/A  New problem(s) identified: None Identified  Reason for Continuation of Hospitalization: Anxiety Depression Medication stabilization Suicidal ideation  Interventions implemented related to continuation of hospitalization: mood stabilization, medication monitoring and adjustment, group therapy and psycho education, safety checks q 15 mins  Additional comments: N/A  Estimated length of stay: 3-5 days  Discharge Plan: SW will assess for appropriate follow up.    New goal(s): N/A  Review of initial/current patient goals per problem list:    1.  Goal(s): Reduce depressive symptoms  Met:  No  Target date: by discharge  As evidenced by: Reducing depression from a 10 to a 3 as reported by pt.   2.  Goal (s): Reduce/Eliminate suicidal ideation  Met:  No  Target date: by discharge  As evidenced by: pt reporting no SI.    3.  Goal(s): Reduce anxiety symptoms  Met:  No  Target date: by discharge  As evidenced by: Reduce anxiety from a 10 to a 3 as reported by pt.    Attendees: Patient:  Catherine Costa 02/16/2012 10:44 AM   Family:     Physician:  Orson Aloe, MD  02/16/2012  10:43 AM   Nursing:  Omelia Blackwater, RN 02/16/2012 10:44 AM   Case Manager:  Reyes Ivan, LCSWA 02/16/2012  10:43 AM   Counselor:  Angus Palms, LCSW 02/16/2012  10:43 AM   Other:  Juline Patch, LCSW 02/16/2012  10:43 AM     Other:  Barrie Folk, RN 02/16/2012 10:44 AM   Other:     Other:      Scribe for Treatment Team:   Carmina Miller, 02/16/2012 , 10:43 AM

## 2012-02-16 NOTE — Progress Notes (Signed)
D: Pt denies SI/HI/AV. Pt is pleasant and cooperative. Pt rates depression at a 3, anxiety at a 5, and Helplessness/hopelessness at a 3. Pt complains of insomnia. Pt currently is having custody battle issues with her ex husband which is causing her to be stressed.   A: Pt was offered support and encouragement. Pt was given scheduled medications. Pt was encourage to attend groups. Q 15 minute checks were done for safety.  R:Pt attends groups and interacts well with peers and staff. Pt is taking medication. Pt receptive to treatment and safety maintained on unit.

## 2012-02-16 NOTE — Progress Notes (Signed)
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 Pneumonia   . Depression   . Crohn's disease    Axis IV: housing problems, other psychosocial or environmental problems and problems related to social environment Axis V: 41-50 serious symptoms  ADL's:  Intact  Sleep: Fair, trouble getting to sleep and then having a hangover in the AM. Uses CPAP at home will order for her to use.  Appetite:  Fair  Suicidal Ideation:  Pt denies any thoughts, plans, intent of suicide Homicidal Ideation:  Pt denies any thoughts, plans, intent of homicide  AEB (as evidenced by): per pt report  Mental Status Examination/Evaluation: Objective:  Appearance: Casual  Eye Contact::  Fair  Speech:  Clear and Coherent  Volume:  Normal  Mood:  Anxious and Depressed, will try Inderal for the anxiety  Affect:  Blunt  Thought Process:  Coherent  Orientation:  Full  Thought Content:  WDL  Suicidal Thoughts:  No  Homicidal Thoughts:  No  Memory:  Immediate;   Fair Recent;   Fair Remote;   Fair  Judgement:  Fair  Insight:  Fair  Psychomotor Activity:  Normal  Concentration:  Fair  Recall:  Fair  Akathisia:  No  Handed:  Right  AIMS (if indicated):     Assets:  Communication Skills Desire for Improvement  Sleep:  Number of Hours: 4.75    ROS: Neuro: no ataxia, weakness, but having trouble with migraine headaches, will try Inderal for that.  GI: no N/V/D/cramps, slight constipation uses Miralax for that.  MS: no weakness, muscle cramps, but noting pain/ headaches.  Vital Signs:Blood pressure 118/79, pulse 94, temperature 98 F (36.7 C), temperature source Oral, resp. rate 16, height 5\' 5"  (1.651 m), weight 78.019 kg (172 lb). Current Medications: Current Facility-Administered Medications  Medication Dose Route  Frequency Provider Last Rate Last Dose  . albuterol (PROVENTIL HFA;VENTOLIN HFA) 108 (90 BASE) MCG/ACT inhaler 2 puff  2 puff Inhalation Q6H PRN Cleotis Nipper, MD   2 puff at 02/15/12 0857  . albuterol (PROVENTIL) (5 MG/ML) 0.5% nebulizer solution 2.5 mg  2.5 mg Nebulization TID PRN Cleotis Nipper, MD   2.5 mg at 02/14/12 2212  . benzonatate (TESSALON) capsule 100 mg  100 mg Oral TID Cleotis Nipper, MD   100 mg at 02/16/12 1709  . citalopram (CELEXA) tablet 40 mg  40 mg Oral Daily Wonda Cerise, MD   40 mg at 02/16/12 0834  . fluconazole (DIFLUCAN) tablet 150 mg  150 mg Oral Daily Wonda Cerise, MD   150 mg at 02/16/12 0834  . gabapentin (NEURONTIN) capsule 100 mg  100 mg Oral BID Cleotis Nipper, MD   100 mg at 02/16/12 1709  . guaiFENesin (MUCINEX) 12 hr tablet 600 mg  600 mg Oral BID Cleotis Nipper, MD   600 mg at 02/16/12 1709  . hydrOXYzine (ATARAX/VISTARIL) tablet 25 mg  25 mg Oral TID PRN Wonda Cerise, MD   25 mg at 02/16/12 1446  . hydrOXYzine (ATARAX/VISTARIL) tablet 50 mg  50 mg Oral QHS,MR X 1 Mike Craze, MD      . ibuprofen (ADVIL,MOTRIN) tablet 200 mg  200 mg Oral Q6H PRN Cleotis Nipper, MD   200 mg at 02/15/12 0858  . ipratropium (ATROVENT) nebulizer solution  0.5 mg  0.5 mg Nebulization TID PRN Cleotis Nipper, MD   0.5 mg at 02/14/12 2212  . levofloxacin (LEVAQUIN) tablet 750 mg  750 mg Oral Daily Cleotis Nipper, MD   750 mg at 02/16/12 0834  . nicotine (NICODERM CQ - dosed in mg/24 hours) patch 21 mg  21 mg Transdermal Q0600 Mike Craze, MD   21 mg at 02/16/12 1610  . pantoprazole (PROTONIX) EC tablet 40 mg  40 mg Oral Q1200 Cleotis Nipper, MD   40 mg at 02/16/12 1212  . traZODone (DESYREL) tablet 200 mg  200 mg Oral QHS Mike Craze, MD      . DISCONTD: traZODone (DESYREL) tablet 150 mg  150 mg Oral QHS Wonda Cerise, MD   150 mg at 02/15/12 2210    Lab Results: No results found for this or any previous visit (from the past 48 hour(s)).  Physical Findings: AIMS: Facial and Oral  Movements Muscles of Facial Expression: None, normal Lips and Perioral Area: None, normal Jaw: None, normal Tongue: None, normal,Extremity Movements Upper (arms, wrists, hands, fingers): None, normal Lower (legs, knees, ankles, toes): None, normal, Trunk Movements Neck, shoulders, hips: None, normal, Overall Severity Severity of abnormal movements (highest score from questions above): None, normal Incapacitation due to abnormal movements: None, normal Patient's awareness of abnormal movements (rate only patient's report): No Awareness, Dental Status Current problems with teeth and/or dentures?: No Does patient usually wear dentures?: No  CIWA:    COWS:     Treatment Plan Summary: Daily contact with patient to assess and evaluate symptoms and progress in treatment Medication management Mood/anxiety less than 3/10 where the scale is 1 is the best and 10 is the worst No suicidal or homicidal thoughts for at least 48 hours.  Plan: Continue Celexa, increase Trazodone and Vistaril at HS for insomnia. Allow her to use CPAP machine form home.  Try Inderal for migraine headaches and anxiety.  May need to increase Neurontin tomorrow for better pain management.  Loa Idler 02/16/2012, 6:01 PM

## 2012-02-16 NOTE — H&P (Signed)
Medical/psychiatric screening examination/treatment/procedure(s) were performed by non-physician practitioner and as supervising physician I was immediately available for consultation/collaboration.   

## 2012-02-17 DIAGNOSIS — F339 Major depressive disorder, recurrent, unspecified: Secondary | ICD-10-CM

## 2012-02-17 MED ORDER — GABAPENTIN 100 MG PO CAPS
200.0000 mg | ORAL_CAPSULE | Freq: Four times a day (QID) | ORAL | Status: DC
  Administered 2012-02-17 – 2012-02-18 (×6): 200 mg via ORAL
  Filled 2012-02-17 (×13): qty 2

## 2012-02-17 MED ORDER — IBUPROFEN 600 MG PO TABS
600.0000 mg | ORAL_TABLET | Freq: Four times a day (QID) | ORAL | Status: DC | PRN
Start: 2012-02-17 — End: 2012-02-19
  Administered 2012-02-17 – 2012-02-18 (×2): 600 mg via ORAL
  Filled 2012-02-17 (×2): qty 1
  Filled 2012-02-17: qty 28

## 2012-02-17 NOTE — Tx Team (Signed)
Interdisciplinary Treatment Plan Update (Adult)  Date:  02/17/2012  Time Reviewed:  10:54 AM   Progress in Treatment: Attending groups: Yes Participating in groups:  Yes Taking medication as prescribed: Yes Tolerating medication:  Yes Family/Significant other contact made:  No Patient understands diagnosis:  Yes Discussing patient identified problems/goals with staff:  Yes Medical problems stabilized or resolved:  Yes Denies suicidal/homicidal ideation: Yes Issues/concerns per patient self-inventory:  None identified Other: N/A  New problem(s) identified: None Identified  Reason for Continuation of Hospitalization: Anxiety Depression Medication stabilization  Interventions implemented related to continuation of hospitalization: mood stabilization, medication monitoring and adjustment, group therapy and psycho education, safety checks q 15 mins  Additional comments: N/A  Estimated length of stay: 2-3 days  Discharge Plan: Pt is scheduled to follow up with [facility] for therapy and medication management.    New goal(s): N/A  Review of initial/current patient goals per problem list:    1.  Goal(s): Reduce depressive symptoms  Met:  No  Target date: by discharge  As evidenced by: Reducing depression from a 10 to a 3 as reported by pt.  Pt rates at a 4 today.    2.  Goal (s): Reduce/Eliminate suicidal ideation  Met:  Yes  Target date: by discharge  As evidenced by: pt reporting no SI.    3.  Goal(s): Reduce anxiety symptoms  Met:  No  Target date: by discharge  As evidenced by: Reduce anxiety from a 10 to a 3 as reported by pt. Pt rates at a 7 today.     Attendees: Patient:  Catherine Costa 02/17/2012 10:54 AM   Family:     Physician:  Orson Aloe, MD  02/17/2012  10:54 AM   Nursing:   Carney Living, RN 02/17/2012 10:54 AM   Case Manager:  Reyes Ivan, LCSWA 02/17/2012  10:54 AM   Counselor:  Angus Palms, LCSW 02/17/2012  10:54 AM   Other:   Juline Patch, LCSW 02/17/2012  10:54 AM   Other:     Other:     Other:      Scribe for Treatment Team:   Carmina Miller, 02/17/2012 , 10:54 AM

## 2012-02-17 NOTE — Progress Notes (Signed)
Psychoeducational Group Note  Date:  02/17/2012 Time:  1100  Group Topic/Focus:  Recovery Goals:   The focus of this group is to identify appropriate goals for recovery and establish a plan to achieve them.  Participation Level:  Active  Participation Quality:  Appropriate and Attentive  Affect:  Appropriate  Cognitive:  Appropriate  Insight:  Good  Engagement in Group:  Good  Additional Comments:  Pt was appropriate and attentive while attending group. Pt did not interact as much as other pt's within the group but appeared very interested in the information being provided.   Sharyn Lull 02/17/2012, 1:22 PM

## 2012-02-17 NOTE — Progress Notes (Signed)
Patient ID: Catherine Costa, female   DOB: Jun 04, 1971, 41 y.o.   MRN: [id number] Patient reports poor sleep and good appetite.  Her energy level is low and her ability to pay attention is good.  She has had a headache that was partially relieved by medication.  Patient was hoping to be discharged to go to court today but realized she still needs to be here after conferring with MD.  Patient has been participating in all her groups. She rated her anxiety a 7 this am but seems to have gotten calmer as day progressed.

## 2012-02-17 NOTE — Progress Notes (Signed)
[facility] Group Notes: (Counselor/Nursing/MHT/Case Management/Adjunct) 02/17/2012   @ 1:15-2:30PM Feelings About Diagnosis and Recovery  Type of Therapy:  Group Therapy  Participation Level:  Active  Participation Quality: Appropriate, Sharing, Supportive    Affect:  Appropriate  Cognitive:  Appropriate  Insight:  Good  Engagement in Group: Good  Engagement in Therapy:  Good  Modes of Intervention:  Support and Exploration  Summary of Progress/Problems: Catherine Costa talked about her concept of recovery - being able to get to the point where she can again face obstacles and deal with them effectively. She shared that she does not want to get back to the person she has been because she knows so much has changed in the meantime and she wants to be the best Catherine Costa that she can be now. She was supportive of others exploring what discovery means to them, and processed how she will know that she is truly recovering in various areas of her life. [name] participated in discussion of the narrative therapy piece "[name]" and related well to the experience of being "counted out." She stated that the story gave her hope about what she can make her life into.  Angus Palms, LCSW 02/17/2012  3:17 PM

## 2012-02-17 NOTE — Progress Notes (Signed)
Pt currently resting in bed. Pt has no complaints.Pt breath sounds are even and unlabored. Pt safety is maintain by q 15 minute checks. 

## 2012-02-17 NOTE — Progress Notes (Signed)
Pt attended discharge planning group and actively participated in group.  SW provided pt with today's workbook.  Pt presents with anxious mood and affect.  Pt rates depression at a 4 and anxiety at a 7 today.  Pt denies SI.  Pt states that she has an important court date tomorrow for child support.  Pt states that she wants to d/c home to get to this court date.  Pt states that she is afraid if she doesn't show up tomorrow it will be continued again.  Pt doesn't appear ready for d/c.  Pt is scheduled to follow up at [facility] for therapy and medication and management.  No further needs voiced by pt at this time.    Reyes Ivan, LCSWA 02/17/2012  10:13 AM
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
## 2012-02-17 NOTE — Progress Notes (Signed)
Psychoeducational Group Note  Date:  02/17/2012 Time:  1000  Group Topic/Focus:  Pt participated in a therapeutic activity playing an Advertising account executive where they asked each other questions on a rubber ball. They also played apples to apples and each person stated a fun activity they can do at home.   Participation Level:  Active  Participation Quality:  Appropriate  Affect:  Appropriate  Cognitive:  Appropriate  Insight:  Good  Engagement in Group:  Good  Additional Comments:  Lakeyn said that one activity she could do for fun would be swimming.   Alyson Reedy 02/17/2012, 11:17 AM

## 2012-02-18 MED ORDER — CITALOPRAM HYDROBROMIDE 40 MG PO TABS: 40.0000 mg | ORAL_TABLET | Freq: Every day | ORAL | Status: DC

## 2012-02-18 MED ORDER — HYDROXYZINE HCL 25 MG PO TABS: 25.0000 mg | ORAL_TABLET | Freq: Three times a day (TID) | ORAL | Status: AC | PRN

## 2012-02-18 MED ORDER — GABAPENTIN 100 MG PO CAPS
100.0000 mg | ORAL_CAPSULE | Freq: Four times a day (QID) | ORAL | Status: DC
  Administered 2012-02-18 – 2012-02-19 (×4): 100 mg via ORAL
  Filled 2012-02-18: qty 1
  Filled 2012-02-18 (×6): qty 56
  Filled 2012-02-18 (×2): qty 1
  Filled 2012-02-18 (×2): qty 56
  Filled 2012-02-18 (×3): qty 1

## 2012-02-18 MED ORDER — GABAPENTIN 100 MG PO CAPS: 100.0000 mg | ORAL_CAPSULE | Freq: Two times a day (BID) | ORAL | Status: DC

## 2012-02-18 MED ORDER — IBUPROFEN 200 MG PO TABS: 600.0000 mg | ORAL_TABLET | Freq: Four times a day (QID) | ORAL | Status: DC | PRN

## 2012-02-18 MED ORDER — TRAZODONE HCL 100 MG PO TABS: 200.0000 mg | ORAL_TABLET | Freq: Every evening | ORAL | Status: DC | PRN

## 2012-02-18 NOTE — Progress Notes (Signed)
[facility] Group Notes:  (Counselor/Nursing/MHT/Case Management/Adjunct)  02/18/2012 3:38 PM  Type of Therapy:  Psychoeducational Skills  Participation Level:  Active  Participation Quality:  Appropriate, Attentive, Sharing and Supportive  Affect:  Appropriate  Cognitive:  Alert, Appropriate and Oriented  Insight:  Good  Engagement in Group:  Good  Engagement in Therapy:  n/a  Modes of Intervention:  Activity, Education, Problem-solving, Socialization and Support  Summary of Progress/Problems: Jeymi attended psycho education group on labels. Cecille participated in an activity labeling self and peers and choose to label herself as a Charity fundraiser for the activity. Audris was active while the group discussed what labels are, how we use them, and listed positive and negative labels they have used or been called. Weston was given a homework assignment to list 10 words she has been labeled to find the reality of the situation/label.    Wandra Scot 02/18/2012, 3:38 PM

## 2012-02-18 NOTE — Progress Notes (Signed)
Pt attended discharge planning group and actively participated in group.  SW provided pt with today's workbook.  Pt presents with flat affect and depressed mood.  Pt rates depression at a 3 and anxiety at a 4 today.  Pt denies SI.  Pt states that she feels ready to d/c soon.  Pt will follow up at [facility] for medication management and therapy.  No further needs voiced by pt at this time.  Safety planning and suicide prevention discussed.  Pt participated in discussion and acknowledged an understanding of the information provided.       Reyes Ivan, LCSWA 02/18/2012  9:58 AM

## 2012-02-18 NOTE — Progress Notes (Signed)
[facility] Group Notes: (Counselor/Nursing/MHT/Case Management/Adjunct) 02/18/2012   1:15-2:30pm Emotion Regulation  Type of Therapy:  Group Therapy  Participation Level:  Active  Participation Quality: APpropriate    Affect:  Appropriate  Cognitive:  Appropriate  Insight:  Good  Engagement in Group: Good  Engagement in Therapy:  Good  Modes of Intervention:  Support and Exploration  Summary of Progress/Problems: Catherine Costa was very engaged, discussing depression and anger as emotions that she has been overcome by in the past. She explored her physical and psychological symptoms of anger, and participated in breathing and progressive muscle relaxation exercises. She processed the experience and said that it was very helpful in reducing her anxiety  Angus Palms, LCSW 02/18/2012  2:58 PM

## 2012-02-18 NOTE — Progress Notes (Addendum)
[facility] MD Progress Note  02/18/2012 6:27 PM  Diagnosis:   Axis I: Major Depression, Recurrent severe and Insomnia due to mental disorder as well as Cannabis Abuse Axis II: Deferred Axis III:  Past Medical History  Diagnosis Date  . Pneumonia   . Depression   . Crohn's disease    Axis IV: housing problems, other psychosocial or environmental problems and problems related to social environment Axis V: 61-70 mild symptoms  ADL's:  Intact  Sleep: Good, with the addition of Vistaril at HS.Marland Kitchen  Appetite:  Good  Suicidal Ideation:  Pt denies any thoughts, plans, intent of suicide Homicidal Ideation:  Pt denies any thoughts, plans, intent of homicide  AEB (as evidenced by): per pt report  Mental Status Examination/Evaluation: Objective:  Appearance: Casual  Eye Contact::  Fair  Speech:  Clear and Coherent  Volume:  Normal  Mood:  Euthymic,  Inderal helps the anxiety  Affect:  Blunt  Thought Process:  Coherent  Orientation:  Full  Thought Content:  WDL  Suicidal Thoughts:  No  Homicidal Thoughts:  No  Memory:  Immediate;   Good Recent;   Good Remote;   Good  Judgement:  Good  Insight:  Good  Psychomotor Activity:  Normal  Concentration:  Good  Recall:  Good  Akathisia:  No  Handed:  Right  AIMS (if indicated):     Assets:  Communication Skills Desire for Improvement  Sleep:  Number of Hours: 5.5    ROS: Neuro: no ataxia, weakness, headaches, Inderal helping migraines too.  GI: no N/V/D/cramps, Miralax helping constipation.  MS: no weakness, muscle cramps, aches.  Vital Signs:Blood pressure 115/78, pulse 93, temperature 98.2 F (36.8 C), temperature source Oral, resp. rate 16, height 5\' 5"  (1.651 m), weight 78.019 kg (172 lb). Current Medications: Current Facility-Administered Medications  Medication Dose Route Frequency Provider Last Rate Last Dose  . albuterol (PROVENTIL HFA;VENTOLIN HFA) 108 (90 BASE) MCG/ACT inhaler 2 puff  2 puff Inhalation Q6H PRN Cleotis Nipper, MD   2 puff at 02/15/12 0857  . albuterol (PROVENTIL) (5 MG/ML) 0.5% nebulizer solution 2.5 mg  2.5 mg Nebulization TID PRN Cleotis Nipper, MD   2.5 mg at 02/14/12 2212  . benzonatate (TESSALON) capsule 100 mg  100 mg Oral TID Cleotis Nipper, MD   100 mg at 02/18/12 1631  . citalopram (CELEXA) tablet 40 mg  40 mg Oral Daily Wonda Cerise, MD   40 mg at 02/18/12 0752  . gabapentin (NEURONTIN) capsule 100 mg  100 mg Oral QID Mike Craze, MD      . guaiFENesin [facility]) 12 hr tablet 600 mg  600 mg Oral BID Cleotis Nipper, MD   600 mg at 02/18/12 1631  . hydrOXYzine (ATARAX/VISTARIL) tablet 25 mg  25 mg Oral TID PRN Wonda Cerise, MD   25 mg at 02/17/12 0807  . hydrOXYzine (ATARAX/VISTARIL) tablet 50 mg  50 mg Oral QHS,MR X 1 Mike Craze, MD   50 mg at 02/17/12 2212  . ibuprofen (ADVIL,MOTRIN) tablet 600 mg  600 mg Oral Q6H PRN Mike Craze, MD   600 mg at 02/18/12 1102  . ipratropium (ATROVENT) nebulizer solution 0.5 mg  0.5 mg Nebulization TID PRN Cleotis Nipper, MD   0.5 mg at 02/14/12 2212  . levofloxacin (LEVAQUIN) tablet 750 mg  750 mg Oral Daily Cleotis Nipper, MD   750 mg at 02/18/12 0752  . nicotine (NICODERM CQ - dosed in mg/24 hours)  patch 21 mg  21 mg Transdermal Q0600 Mike Craze, MD   21 mg at 02/18/12 0654  . pantoprazole (PROTONIX) EC tablet 40 mg  40 mg Oral Q1200 Cleotis Nipper, MD   40 mg at 02/18/12 1100  . polyethylene glycol (MIRALAX / GLYCOLAX) packet 17 g  17 g Oral Daily Mike Craze, MD   17 g at 02/18/12 0753  . propranolol (INDERAL) tablet 10 mg  10 mg Oral TID Mike Craze, MD   10 mg at 02/18/12 1310  . traZODone (DESYREL) tablet 200 mg  200 mg Oral QHS Mike Craze, MD   200 mg at 02/17/12 2211  . DISCONTD: gabapentin (NEURONTIN) capsule 200 mg  200 mg Oral QID Mike Craze, MD   200 mg at 02/18/12 1632    Lab Results: No results found for this or any previous visit (from the past 48 hour(s)).  Physical Findings: AIMS: Facial and Oral Movements Muscles  of Facial Expression: None, normal Lips and Perioral Area: None, normal Jaw: None, normal Tongue: None, normal,Extremity Movements Upper (arms, wrists, hands, fingers): None, normal Lower (legs, knees, ankles, toes): None, normal, Trunk Movements Neck, shoulders, hips: None, normal, Overall Severity Severity of abnormal movements (highest score from questions above): None, normal Incapacitation due to abnormal movements: None, normal Patient's awareness of abnormal movements (rate only patient's report): No Awareness, Dental Status Current problems with teeth and/or dentures?: No Does patient usually wear dentures?: No  CIWA:    COWS:     Treatment Plan Summary: Daily contact with patient to assess and evaluate symptoms and progress in treatment Medication management Mood/anxiety less than 3/10 where the scale is 1 is the best and 10 is the worst No suicidal or homicidal thoughts for at least 48 hours.  Plan: Pt has met inpatient goals and is ready for D/C.  Will D/C tomorrow.  Maudy Yonan 02/18/2012, 6:27 PM

## 2012-02-18 NOTE — Progress Notes (Signed)
[facility] Group Notes:  (Counselor/Nursing/MHT/Case Management/Adjunct)  02/18/2012 10:23 PM  Type of Therapy:  Wrap up group  Participation Level:  Active  Participation Quality:  Appropriate  Affect:  Appropriate  Cognitive:  Appropriate  Insight:  Good  Engagement in Group:  Good  Engagement in Therapy:  Good  Modes of Intervention:  Wrap up group  Summary of Progress/Problems: Patient was actively participating in group. Very happy and full of laughter.   Juanda Chance Jvette 02/18/2012, 10:23 PM

## 2012-02-18 NOTE — Progress Notes (Signed)
Psychoeducational Group Note  Date:  02/18/2012 Time:  1100  Group Topic/Focus:  Personal Choices and Values:   The focus of this group is to help patients assess and explore the importance of values in their lives, how their values affect their decisions, how they express their values and what opposes their expression.  Participation Level:  Active  Participation Quality:  Appropriate and Attentive  Affect:  Appropriate  Cognitive:  Appropriate  Insight:  Good  Engagement in Group:  Good  Additional Comments:  Pt participated in group. Pt explained what values were important, and how values have changed over a person's lifespan. Pt also completed worksheet on identifying values and choosing a value-oriented life worksheet. Pt shared and explained how she valued her family.  Karleen Hampshire Brittini 02/18/2012, 3:05 PM

## 2012-02-18 NOTE — Progress Notes (Signed)
D: Patient denies SI/HI. Patient displays an appropriate affect but has a depressed mood. The patient rates her depression a 2 out of 10 (1 low/10 high) and her hopelessness a 1 out of 10 (1 low/10 high). The patient reports sleeping fairly well and states that her appetite and energy level are good. The patient is attending groups on the unit and is interacting appropriately on the unit. The patient is reporting some head pain.  A: Patient given emotional support from RN. Patient encouraged to come to staff with concerns and/or questions. Patient's medication routine continued. Patient's orders and plan of care reviewed. Patient given prn ibuprofen for headache.  R: Patient remains appropriate and cooperative. Will continue to monitor patient q15 minutes for safety.

## 2012-02-18 NOTE — Discharge Summary (Addendum)
Physician Discharge Summary Note  Patient:  Catherine Costa is an 41 y.o., female MRN:  [id number] DOB:  12-23-1970 Patient phone:  [phone] (home)  Patient address:   [address] 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  Pneumonia    .  Depression    .  Crohn's disease     Axis IV: housing problems, other psychosocial or environmental problems and problems related to social environment  Axis V: 61-70 mild symptoms  Level of Care:  OP  Hospital Course:   Catherine Costa returned to the ED after being discharged form [facility] on 02/09/2012 with thoughts of suicide. She is undergoing a great deal of stress due to her recent divorce and her 67 yr old daughter getting ready to under go treatment for thyroid cancer. She currently lives with her 43 yr old daughter and grandchild with her 31 yr old son. There is a lot of drama in the family. She was unable to continue her medications due to finances.   While a patient in this hospital, Catherine Costa received medication management for depression, anxiety, social anxiety, migraines, and insomnia. They were ordered and received Celexa, Neurontin, Vistaril, Inderal, and Trazodone for those conditions.  They were also enrolled in group counseling sessions and activities in which they participated actively.   Patient attended treatment team meeting this am and met with treatment team members. Pt symptoms, treatment plan and response to treatment discussed. Catherine Costa endorsed that their symptoms have improved. Pt also stated that they are stable for discharge.  They reported that from this hospital stay they had learned that it is okay to put  themselves first occasionally.  In other to maintain control of their depression, anxiety, social anxiety, migraines, pain and sleep wake cycle, they will continue psychiatric care on outpatient basis. They will follow-up at [facility] on 8/29 with Catherine Costa at 1400.  In addition they were instructed to use a 3X5 card to help them develop a method to manage their anxiety and distress by listing out 5 things they can do through thoughts and through breathing to calm themselves down or list what works to help them cope by going and doing things, to take all your medications as prescribed by your mental healthcare provider, to report any adverse effects and or reactions from your medicines to your outpatient provider promptly, patient is instructed and cautioned to not engage in alcohol and or illegal drug use while on prescription medicines, in the event of worsening symptoms, patient is instructed to call the crisis hotline, 911 and or go to the nearest ED for appropriate evaluation and treatment of symptoms.   Upon discharge, patient adamantly denies suicidal, homicidal ideations, auditory, visual hallucinations and or delusional thinking. They left [facility] with all personal belongings via personal transportation in no apparent distress.  Consults:  None  Significant Diagnostic Studies:  labs: UDS positive for THC, CMET, CBC, UA, BAL non contributory  Discharge Vitals:   Blood pressure 104/70, pulse 94, temperature 98.1 F (36.7 C), temperature source Oral, resp. rate 16, height 5\' 5"  (1.651 m), weight 78.019 kg (172 lb)..  Mental Status Exam: See Mental Status  Examination and Suicide Risk Assessment completed by Attending Physician prior to discharge.  Discharge destination:  Home  Is patient on multiple antipsychotic therapies at discharge:  No  Has Patient had three or more failed trials of antipsychotic monotherapy by history: N/A Recommended Plan for Multiple Antipsychotic Therapies:  N/A  Medication List  As of 02/19/2012  1:24 PM   STOP taking these medications         benzonatate 100 MG capsule      ipratropium 0.02 % nebulizer solution      levofloxacin 750 MG tablet         TAKE these medications      Indication    albuterol 108 (90 BASE) MCG/ACT inhaler   Commonly known as: PROVENTIL HFA;VENTOLIN HFA   Inhale 2 puffs into the lungs every 6 (six) hours as needed for wheezing or shortness of breath.       albuterol (5 MG/ML) 0.5% nebulizer solution   Commonly known as: PROVENTIL   Take 0.5 mLs (2.5 mg total) by nebulization 3 (three) times daily as needed for wheezing or shortness of breath.       citalopram 40 MG tablet   Commonly known as: CELEXA   Take 1 tablet (40 mg total) by mouth daily. For depression.       citalopram 40 MG tablet   Commonly known as: CELEXA   Take 1 tablet (40 mg total) by mouth daily. For depression.       gabapentin 100 MG capsule   Commonly known as: NEURONTIN   Take 1 capsule (100 mg total) by mouth 2 (two) times daily. For anxiety       guaiFENesin 600 MG 12 hr tablet   Commonly known as: MUCINEX   Take 1 tablet (600 mg total) by mouth 2 (two) times daily as needed for congestion.    Indication: Cough      hydrOXYzine 25 MG tablet   Commonly known as: ATARAX/VISTARIL   Take 1 tablet (25 mg total) by mouth 3 (three) times daily as needed for anxiety.       ibuprofen 200 MG tablet   Commonly known as: ADVIL,MOTRIN   Take 3 tablets (600 mg total) by mouth every 6 (six) hours as needed for pain or headache.       omeprazole 20 MG capsule   Commonly known as: PRILOSEC   Take 1 capsule (20 mg total) by mouth 2 (two) times daily. For control of stomach acid secretion and helps GERD.       propranolol 10 MG tablet   Commonly known as: INDERAL   Take 1 tablet (10 mg total) by mouth 3 (three) times daily. For anxiety       traZODone 100 MG tablet   Commonly known as: DESYREL   Take 2 tablets (200 mg total) by mouth  at bedtime as needed (insomnia).            Follow-up Information    Follow up with [facility] on 02/26/2012. (Appointment scheduled at 2:00 pm with Archie Patten)    Contact information:   [address] [place], [place] [address] [phone]         Follow-up recommendations:   Activities: Resume typical activities Diet: Resume typical diet Tests: none Other: Follow up with outpatient provider and report any side effects to out patient prescriber.  Comments:  Take all your medications as prescribed by your mental healthcare provider. Report any adverse effects and or reactions  from your medicines to your outpatient provider promptly. Patient is instructed and cautioned to not engage in alcohol and or illegal drug use while on prescription medicines. In the event of worsening symptoms, patient is instructed to call the crisis hotline, 911 and or go to the nearest ED for appropriate evaluation and treatment of symptoms.  SignedDan Humphreys, Keante Urizar 02/19/2012 1:24 PM

## 2012-02-18 NOTE — Progress Notes (Signed)
Patient in bed talking to her room mate. She reported that she is feeling sleepy and getting ready to sleep. Mood and affect seemed appropriate. Writer offered to give her medication for sleep if she needed any. Pt stated no. Q 15 minute check continues to maintain safety.

## 2012-02-18 NOTE — Progress Notes (Signed)
I agree with this note.  

## 2012-02-19 MED ORDER — PROPRANOLOL HCL 10 MG PO TABS: 10.0000 mg | ORAL_TABLET | Freq: Three times a day (TID) | ORAL | Status: DC

## 2012-02-19 NOTE — [facility] Suicide Risk Assessment (Signed)
Suicide Risk Assessment  Discharge Assessment     Demographic factors:  Caucasian (separated)    Current Mental Status Per Nursing Assessment::   On Admission:    At Discharge:     Historical Factors: Prior suicide attempts  Continued Clinical Symptoms:  Severe Anxiety and/or Agitation Depression:   Anhedonia Insomnia Chronic Pain Previous Psychiatric Diagnoses and Treatments  Cognitive Features That Contribute To Risk:  Thought constriction (tunnel vision)    Suicide Risk:  Minimal: No identifiable suicidal ideation.  Patients presenting with no risk factors but with morbid ruminations; may be classified as minimal risk based on the severity of the depressive symptoms  Discharge Diagnosis:   Axis I: Major Depression, Recurrent severe and Insomnia due to mental disorder as well as Cannabis Abuse Axis II: Deferred Axis III:  Past Medical History  Diagnosis Date  . Pneumonia   . Depression   . Crohn's disease    Axis IV: housing problems, other psychosocial or environmental problems and problems related to social environment Axis V: 61-70 mild symptoms  Current Mental Status per Physician ADL's:  Intact  Sleep: Good, with the addition of Vistaril at HS.Marland Kitchen  Appetite:  Good  Suicidal Ideation:  Pt denies any thoughts, plans, intent of suicide Homicidal Ideation:  Pt denies any thoughts, plans, intent of homicide  AEB (as evidenced by): per pt report  Mental Status Examination/Evaluation: Objective:  Appearance: Casual  Eye Contact::  Fair  Speech:  Clear and Coherent  Volume:  Normal  Mood:  Euthymic,  Inderal helps the anxiety  Affect:  Blunt  Thought Process:  Coherent  Orientation:  Full  Thought Content:  WDL  Suicidal Thoughts:  No  Homicidal Thoughts:  No  Memory:  Immediate;   Good Recent;   Good Remote;   Good  Judgement:  Good  Insight:  Good  Psychomotor Activity:  Normal  Concentration:  Good  Recall:  Good  Akathisia:  No  Handed:   Right  AIMS (if indicated):     Assets:  Communication Skills Desire for Improvement  Sleep:  Number of Hours: 5.5    ROS: Neuro: no ataxia, weakness, headaches, Inderal helping migraines too.  GI: no N/V/D/cramps, Miralax helping constipation.  MS: no weakness, muscle cramps, aches.  Vital Signs:Blood pressure 115/78, pulse 93, temperature 98.2 F (36.8 C), temperature source Oral, resp. rate 16, height 5\' 5"  (1.651 m), weight 78.019 kg (172 lb). Current Medications: Current Facility-Administered Medications  Medication Dose Route Frequency Provider Last Rate Last Dose  . albuterol (PROVENTIL HFA;VENTOLIN HFA) 108 (90 BASE) MCG/ACT inhaler 2 puff  2 puff Inhalation Q6H PRN Cleotis Nipper, MD   2 puff at 02/15/12 0857  . albuterol (PROVENTIL) (5 MG/ML) 0.5% nebulizer solution 2.5 mg  2.5 mg Nebulization TID PRN Cleotis Nipper, MD   2.5 mg at 02/14/12 2212  . benzonatate (TESSALON) capsule 100 mg  100 mg Oral TID Cleotis Nipper, MD   100 mg at 02/18/12 1631  . citalopram (CELEXA) tablet 40 mg  40 mg Oral Daily Wonda Cerise, MD   40 mg at 02/18/12 0752  . gabapentin (NEURONTIN) capsule 100 mg  100 mg Oral QID Mike Craze, MD      . guaiFENesin [facility]) 12 hr tablet 600 mg  600 mg Oral BID Cleotis Nipper, MD   600 mg at 02/18/12 1631  . hydrOXYzine (ATARAX/VISTARIL) tablet 25 mg  25 mg Oral TID PRN Wonda Cerise, MD   25 mg at  02/17/12 0807  . hydrOXYzine (ATARAX/VISTARIL) tablet 50 mg  50 mg Oral QHS,MR X 1 Mike Craze, MD   50 mg at 02/17/12 2212  . ibuprofen (ADVIL,MOTRIN) tablet 600 mg  600 mg Oral Q6H PRN Mike Craze, MD   600 mg at 02/18/12 1102  . ipratropium (ATROVENT) nebulizer solution 0.5 mg  0.5 mg Nebulization TID PRN Cleotis Nipper, MD   0.5 mg at 02/14/12 2212  . levofloxacin (LEVAQUIN) tablet 750 mg  750 mg Oral Daily Cleotis Nipper, MD   750 mg at 02/18/12 0752  . nicotine (NICODERM CQ - dosed in mg/24 hours) patch 21 mg  21 mg Transdermal Q0600 Mike Craze, MD   21 mg at  02/18/12 0654  . pantoprazole (PROTONIX) EC tablet 40 mg  40 mg Oral Q1200 Cleotis Nipper, MD   40 mg at 02/18/12 1100  . polyethylene glycol (MIRALAX / GLYCOLAX) packet 17 g  17 g Oral Daily Mike Craze, MD   17 g at 02/18/12 0753  . propranolol (INDERAL) tablet 10 mg  10 mg Oral TID Mike Craze, MD   10 mg at 02/18/12 1310  . traZODone (DESYREL) tablet 200 mg  200 mg Oral QHS Mike Craze, MD   200 mg at 02/17/12 2211  . DISCONTD: gabapentin (NEURONTIN) capsule 200 mg  200 mg Oral QID Mike Craze, MD   200 mg at 02/18/12 1632    Lab Results:  No results found for this or any previous visit (from the past 72 hour(s)).  RISK REDUCTION FACTORS: What pt has learned from hospital stay is several coping skills such stop breath and look at the situation, take time for themself and overall that their life is worth living.  Risk of self harm is elevated by their depression, anxiety, their chronic medical conditions, and their focus on helping others.  They were not very open to seeing how they were actually addicted to helping/fixing others.  Risk of harm to others is minimal in that they has not been involved in fights or had any legal charges filed on them.  Pt seen in treatment team where they divulged the above information. The treatment team concluded that they was ready for discharge and had met their goals for an inpatient setting.  PLAN: Discharge home Continue Medication List  As of 02/19/2012  1:15 PM   STOP taking these medications         benzonatate 100 MG capsule      ipratropium 0.02 % nebulizer solution      levofloxacin 750 MG tablet         TAKE these medications      Indication    albuterol 108 (90 BASE) MCG/ACT inhaler   Commonly known as: PROVENTIL HFA;VENTOLIN HFA   Inhale 2 puffs into the lungs every 6 (six) hours as needed for wheezing or shortness of breath.       albuterol (5 MG/ML) 0.5% nebulizer solution   Commonly known as: PROVENTIL   Take 0.5  mLs (2.5 mg total) by nebulization 3 (three) times daily as needed for wheezing or shortness of breath.       citalopram 40 MG tablet   Commonly known as: CELEXA   Take 1 tablet (40 mg total) by mouth daily. For depression.       citalopram 40 MG tablet   Commonly known as: CELEXA   Take 1 tablet (40 mg total) by mouth daily. For  depression.       gabapentin 100 MG capsule   Commonly known as: NEURONTIN   Take 1 capsule (100 mg total) by mouth 2 (two) times daily. For anxiety       guaiFENesin 600 MG 12 hr tablet   Commonly known as: MUCINEX   Take 1 tablet (600 mg total) by mouth 2 (two) times daily as needed for congestion.    Indication: Cough      hydrOXYzine 25 MG tablet   Commonly known as: ATARAX/VISTARIL   Take 1 tablet (25 mg total) by mouth 3 (three) times daily as needed for anxiety.       ibuprofen 200 MG tablet   Commonly known as: ADVIL,MOTRIN   Take 3 tablets (600 mg total) by mouth every 6 (six) hours as needed for pain or headache.       omeprazole 20 MG capsule   Commonly known as: PRILOSEC   Take 1 capsule (20 mg total) by mouth 2 (two) times daily. For control of stomach acid secretion and helps GERD.       propranolol 10 MG tablet   Commonly known as: INDERAL   Take 1 tablet (10 mg total) by mouth 3 (three) times daily. For anxiety       traZODone 100 MG tablet   Commonly known as: DESYREL   Take 2 tablets (200 mg total) by mouth at bedtime as needed (insomnia).            Follow-up recommendations:  Activities: Resume typical activities Diet: Resume typical diet Tests: none Other: Follow up with outpatient provider and report any side effects to out patient prescriber.  Plan: Pt has met inpatient goals and is ready for D/C.  Will D/C tomorrow.  Antigone Crowell 02/19/2012, 1:13 PM

## 2012-02-19 NOTE — Progress Notes (Addendum)
2340 D: Writer introduced self to pt. Pt in bed talking to her roommate. Pt denied any concerns she wished for this writer to address at this time.  A: Continued support and availability as needed was extended to this pt. Q33min checks remains in continuation for this pt.  R: Pt remains safe at this time with q68min checks.

## 2012-02-19 NOTE — Progress Notes (Signed)
Psychoeducational Group Note  Date:  02/19/2012 Time:  1000  Group Topic/Focus:  Therapeutic Activity-Question Ball  Participation Level: Did Not Attend  Participation Quality:  Not Applicable  Affect:  Not Applicable  Cognitive:  Not Applicable  Insight:  Not Applicable  Engagement in Group: Not Applicable  Additional Comments:   Noah Charon 02/19/2012, 11:00 AM

## 2012-02-19 NOTE — Progress Notes (Signed)
D: Pt dc,d home with daughter. Pt denies SI,HI, & AVH.A: Pt given samples ,dc instructions & belongings R:Pt verbalized understanding of all dc instructions.

## 2012-02-19 NOTE — Progress Notes (Signed)
Psychoeducational Group Note  Date:  02/19/2012 Time:  1100  Group Topic/Focus:  Overcoming Stress:   The focus of this group is to define stress and help patients assess their triggers.  Participation Level:  Active  Participation Quality:  Appropriate, Attentive and Sharing  Affect:  Appropriate  Cognitive:  Alert and Appropriate  Insight:  Good  Engagement in Group:  Good  Additional Comments:  Pt was very appropriate and attentive while attending group. Pt shared that relationships were a major stressor for her. Pt communicated that she was trying to balance her relationship with husband and fiance and it was hard because she loved both of them.   Sharyn Lull 02/19/2012, 11:58 AM

## 2012-02-19 NOTE — Tx Team (Signed)
Interdisciplinary Treatment Plan Update (Adult)  Date:  02/19/2012  Time Reviewed:  10:16 AM   Progress in Treatment: Attending groups: Yes Participating in groups:  Yes Taking medication as prescribed: Yes Tolerating medication:  Yes Family/Significant other contact made:  Patient understands diagnosis:  Yes Discussing patient identified problems/goals with staff:  Yes Medical problems stabilized or resolved:  Yes Denies suicidal/homicidal ideation: Yes Issues/concerns per patient self-inventory:  None identified Other: N/A  New problem(s) identified: None Identified  Reason for Continuation of Hospitalization: Stable to d/c  Interventions implemented related to continuation of hospitalization: Stable to d/c  Additional comments: N/A  Estimated length of stay: D/C today  Discharge Plan: Pt will follow up with [facility] for medication management and therapy.    New goal(s): N/A  Review of initial/current patient goals per problem list:    1.  Goal(s): Reduce depressive symptoms  Met:  Yes  Target date: by discharge  As evidenced by: Reducing depression from a 10 to a 3 as reported by pt.  Pt rates at a 1 today.   2.  Goal (s): Reduce/Eliminate suicidal ideation  Met:  Yes  Target date: by discharge  As evidenced by: pt reporting no SI.    3.  Goal(s): Reduce anxiety symptoms  Met:  Yes  Target date: by discharge  As evidenced by: Reduce anxiety from a 10 to a 3 as reported by pt. Pt rates at a 3 today.     Attendees: Patient:  Catherine Costa 02/19/2012 10:16 AM   Family:     Physician:  Orson Aloe, MD 02/19/2012 10:16 AM   Nursing:   Nestor Ramp, RN 02/19/2012 10:16 AM   Case Manager:  Reyes Ivan, LCSWA 02/19/2012 10:16 AM   Counselor:  Angus Palms, LCSW 02/19/2012 10:16 AM   Other:  Juline Patch, LCSW 02/19/2012 10:16 AM   Other: Trula Slade, MSW intern 02/19/2012 10:16 AM   Other:  Leida Lauth, RN 02/19/2012 10:36 AM   Other:       Scribe for Treatment Team:   Carmina Miller, 02/19/2012 10:16 AM

## 2012-02-19 NOTE — Progress Notes (Signed)
[facility] Case Management Discharge Plan:  Will you be returning to the same living situation after discharge: Yes,  returning to own home At discharge, do you have transportation home?:Yes,  daughter will pick pt up Do you have the ability to pay for your medications:Yes,  provided samples and prescriptions   Release of information consent forms completed and in the chart;  Patient's signature needed at discharge.  Patient to Follow up at:  Follow-up Information    Follow up with [facility] on 02/26/2012. (Appointment scheduled at 2:00 pm with Archie Patten)    Contact information:   [address], [address] [place], [place] [address] [phone]          Patient denies SI/HI:   Yes,  denies SI/HI today    Safety Planning and Suicide Prevention discussed:  Yes,  discussed with pt today  Barrier to discharge identified:No.  Summary and Recommendations: Pt attended discharge planning group and actively participated in group.  SW provided pt with today's workbook.  Pt presents with calm mood and affect.  Pt rates depression at a 1 and anxiety at a 3 today.  Pt denies SI/HI.  Pt reports feeling stable to d/c today.  No recommendations from SW.  No further needs voiced by pt.  Pt stable to discharge.     Carmina Miller 02/19/2012, 10:36 AM

## 2012-02-20 NOTE — Progress Notes (Signed)
After Visit Summary (AVS) Faxed to: 02/20/2012 Psychiatric Admission Assessment Faxed to: 02/20/2012 Suicide Risk Assessment-Discharge Assessment Faxed to: 02/20/2012  Faxed to: [facility], [phone]  Emilio Aspen, 02/20/2012, 04:00pm

## 2012-05-25 ENCOUNTER — Other Ambulatory Visit: Payer: Self-pay | Admitting: Unknown Physician Specialty

## 2012-05-25 DIAGNOSIS — N63 Unspecified lump in unspecified breast: Secondary | ICD-10-CM

## 2012-06-04 ENCOUNTER — Ambulatory Visit
Admission: RE | Admit: 2012-06-04 | Discharge: 2012-06-04 | Disposition: A | Discharge status: Home / Self Care | Payer: Medicaid Other | Source: Ambulatory Visit | Attending: Unknown Physician Specialty | Admitting: Unknown Physician Specialty

## 2012-06-04 DIAGNOSIS — N63 Unspecified lump in unspecified breast: Secondary | ICD-10-CM

## 2013-01-01 ENCOUNTER — Emergency Department (HOSPITAL_COMMUNITY)
Admission: EM | Admit: 2013-01-01 | Discharge: 2013-01-01 | Disposition: A | Discharge status: Home / Self Care | Payer: Medicaid Other | Attending: Emergency Medicine | Admitting: Emergency Medicine

## 2013-01-01 ENCOUNTER — Emergency Department (HOSPITAL_COMMUNITY): Payer: Medicaid Other

## 2013-01-01 ENCOUNTER — Encounter (HOSPITAL_COMMUNITY): Payer: Self-pay | Admitting: *Deleted

## 2013-01-01 DIAGNOSIS — Z8719 Personal history of other diseases of the digestive system: Secondary | ICD-10-CM | POA: Insufficient documentation

## 2013-01-01 DIAGNOSIS — I10 Essential (primary) hypertension: Secondary | ICD-10-CM | POA: Insufficient documentation

## 2013-01-01 DIAGNOSIS — Z90710 Acquired absence of both cervix and uterus: Secondary | ICD-10-CM | POA: Insufficient documentation

## 2013-01-01 DIAGNOSIS — R059 Cough, unspecified: Secondary | ICD-10-CM | POA: Insufficient documentation

## 2013-01-01 DIAGNOSIS — F17200 Nicotine dependence, unspecified, uncomplicated: Secondary | ICD-10-CM | POA: Insufficient documentation

## 2013-01-01 DIAGNOSIS — R0602 Shortness of breath: Secondary | ICD-10-CM | POA: Insufficient documentation

## 2013-01-01 DIAGNOSIS — Z79899 Other long term (current) drug therapy: Secondary | ICD-10-CM | POA: Insufficient documentation

## 2013-01-01 DIAGNOSIS — J45901 Unspecified asthma with (acute) exacerbation: Secondary | ICD-10-CM | POA: Insufficient documentation

## 2013-01-01 DIAGNOSIS — Z8701 Personal history of pneumonia (recurrent): Secondary | ICD-10-CM | POA: Insufficient documentation

## 2013-01-01 DIAGNOSIS — Z8659 Personal history of other mental and behavioral disorders: Secondary | ICD-10-CM | POA: Insufficient documentation

## 2013-01-01 DIAGNOSIS — R109 Unspecified abdominal pain: Secondary | ICD-10-CM | POA: Insufficient documentation

## 2013-01-01 DIAGNOSIS — R05 Cough: Secondary | ICD-10-CM | POA: Insufficient documentation

## 2013-01-01 HISTORY — DX: Unspecified asthma, uncomplicated: J45.909

## 2013-01-01 HISTORY — DX: Essential (primary) hypertension: I10

## 2013-01-01 LAB — CBC WITH DIFFERENTIAL/PLATELET
Basophils Absolute: 0 10*3/uL (ref 0.0–0.1)
Basophils Relative: 0 % (ref 0–1)
Eosinophils Absolute: 0.3 10*3/uL (ref 0.0–0.7)
MCHC: 34.7 g/dL (ref 30.0–36.0)
Monocytes Absolute: 0.9 10*3/uL (ref 0.1–1.0)
Neutro Abs: 10.4 10*3/uL — ABNORMAL HIGH (ref 1.7–7.7)
Neutrophils Relative %: 77 % (ref 43–77)
RDW: 13 % (ref 11.5–15.5)

## 2013-01-01 LAB — COMPREHENSIVE METABOLIC PANEL
AST: 17 U/L (ref 0–37)
Albumin: 3.8 g/dL (ref 3.5–5.2)
Chloride: 102 mEq/L (ref 96–112)
Creatinine, Ser: 0.84 mg/dL (ref 0.50–1.10)
Potassium: 3.5 mEq/L (ref 3.5–5.1)
Total Bilirubin: 0.3 mg/dL (ref 0.3–1.2)
Total Protein: 7.5 g/dL (ref 6.0–8.3)

## 2013-01-01 MED ORDER — ONDANSETRON HCL 4 MG/2ML IJ SOLN
4.0000 mg | Freq: Once | INTRAMUSCULAR | Status: AC
  Administered 2013-01-01: 4 mg via INTRAVENOUS
  Filled 2013-01-01: qty 2

## 2013-01-01 MED ORDER — HYDROMORPHONE HCL PF 1 MG/ML IJ SOLN
1.0000 mg | Freq: Once | INTRAMUSCULAR | Status: AC
  Administered 2013-01-01: 1 mg via INTRAVENOUS
  Filled 2013-01-01: qty 1

## 2013-01-01 MED ORDER — SODIUM CHLORIDE 0.9 % IV BOLUS (SEPSIS)
1000.0000 mL | Freq: Once | INTRAVENOUS | Status: AC
  Administered 2013-01-01: 1000 mL via INTRAVENOUS

## 2013-01-01 MED ORDER — PANTOPRAZOLE SODIUM 40 MG IV SOLR
40.0000 mg | Freq: Once | INTRAVENOUS | Status: AC
  Administered 2013-01-01: 40 mg via INTRAVENOUS
  Filled 2013-01-01: qty 40

## 2013-01-01 MED ORDER — IOHEXOL 300 MG/ML  SOLN
50.0000 mL | Freq: Once | INTRAMUSCULAR | Status: AC | PRN
  Administered 2013-01-01: 50 mL via ORAL

## 2013-01-01 MED ORDER — HYDROCODONE-ACETAMINOPHEN 5-325 MG PO TABS: 1.0000 | ORAL_TABLET | Freq: Four times a day (QID) | ORAL | Status: DC | PRN

## 2013-01-01 MED ORDER — PROMETHAZINE HCL 25 MG PO TABS: 25.0000 mg | ORAL_TABLET | Freq: Four times a day (QID) | ORAL | Status: DC | PRN

## 2013-01-01 MED ORDER — IOHEXOL 300 MG/ML  SOLN
100.0000 mL | Freq: Once | INTRAMUSCULAR | Status: AC | PRN
  Administered 2013-01-01: 100 mL via INTRAVENOUS

## 2013-01-01 NOTE — ED Notes (Signed)
Pt reporting cough, nausea, body aches and fever for several days. Symptoms worse at night.  Reports treatment for pneumonia about a month ago.

## 2013-01-01 NOTE — ED Notes (Signed)
MD at bedside. 

## 2013-01-02 NOTE — ED Provider Notes (Signed)
History    CSN: [id number] Arrival date & time 01/01/13  4098  First MD Initiated Contact with Patient 01/01/13 1905     Chief Complaint  Patient presents with  . Cough  . Shortness of Breath   (Consider location/radiation/quality/duration/timing/severity/associated sxs/prior Treatment) Patient is a 42 y.o. female presenting with abdominal pain. The history is provided by the patient (pt complains of some abd pain). No language interpreter was used.  Abdominal Pain This is a new problem. The current episode started 12 to 24 hours ago. The problem occurs constantly. The problem has not changed since onset.Associated symptoms include abdominal pain. Pertinent negatives include no chest pain and no headaches. Nothing aggravates the symptoms. Nothing relieves the symptoms.   Past Medical History  Diagnosis Date  . Pneumonia   . Depression   . Crohn's disease   . Hypertension   . Asthma    Past Surgical History  Procedure Laterality Date  . Abdominal hysterectomy    . Fracture surgery     No family history on file. History  Substance Use Topics  . Smoking status: Current Every Day Smoker -- 1.00 packs/day  . Smokeless tobacco: Not on file  . Alcohol Use: No   OB History   Grav Para Term Preterm Abortions TAB SAB Ect Mult Living                 Review of Systems  Constitutional: Negative for appetite change and fatigue.  HENT: Negative for congestion, sinus pressure and ear discharge.   Eyes: Negative for discharge.  Respiratory: Negative for cough.   Cardiovascular: Negative for chest pain.  Gastrointestinal: Positive for abdominal pain. Negative for diarrhea.  Genitourinary: Negative for frequency and hematuria.  Musculoskeletal: Negative for back pain.  Skin: Negative for rash.  Neurological: Negative for seizures and headaches.  Psychiatric/Behavioral: Negative for hallucinations.    Allergies  Bee venom; Sulfa antibiotics; and Voltaren  Home Medications    Current Outpatient Rx  Name  Route  Sig  Dispense  Refill  . albuterol (PROAIR HFA) 108 (90 BASE) MCG/ACT inhaler   Inhalation   Inhale 2 puffs into the lungs every 6 (six) hours as needed for wheezing or shortness of breath.   3.7 g   0   . ibuprofen (ADVIL,MOTRIN) 200 MG tablet   Oral   Take 3 tablets (600 mg total) by mouth every 6 (six) hours as needed for pain or headache.   90 tablet   0   . omeprazole (PRILOSEC) 20 MG capsule   Oral   Take 1 capsule (20 mg total) by mouth 2 (two) times daily. For control of stomach acid secretion and helps GERD.   60 capsule   0   . propranolol (INDERAL) 10 MG tablet   Oral   Take 1 tablet (10 mg total) by mouth 3 (three) times daily. For anxiety   90 tablet   0   . HYDROcodone-acetaminophen (NORCO/VICODIN) 5-325 MG per tablet   Oral   Take 1 tablet by mouth every 6 (six) hours as needed for pain.   20 tablet   0   . promethazine (PHENERGAN) 25 MG tablet   Oral   Take 1 tablet (25 mg total) by mouth every 6 (six) hours as needed for nausea.   15 tablet   0    BP 125/78  Pulse 74  Temp(Src) 98.3 F (36.8 C) (Oral)  Resp 18  Ht 5\' 6"  (1.676 m)  Wt 180 lb (  81.647 kg)  BMI 29.07 kg/m2  SpO2 99% Physical Exam  Constitutional: She is oriented to person, place, and time. She appears well-developed.  HENT:  Head: Normocephalic.  Eyes: Conjunctivae and EOM are normal. No scleral icterus.  Neck: Neck supple. No thyromegaly present.  Cardiovascular: Normal rate and regular rhythm.  Exam reveals no gallop and no friction rub.   No murmur heard. Pulmonary/Chest: No stridor. She has no wheezes. She has no rales. She exhibits no tenderness.  Abdominal: She exhibits no distension. There is tenderness. There is no rebound.  Musculoskeletal: Normal range of motion. She exhibits no edema.  Lymphadenopathy:    She has no cervical adenopathy.  Neurological: She is oriented to person, place, and time. Coordination normal.  Skin: No  rash noted. No erythema.  Psychiatric: She has a normal mood and affect. Her behavior is normal.    ED Course  Procedures (including critical care time) Labs Reviewed  CBC WITH DIFFERENTIAL - Abnormal; Notable for the following:    WBC 13.5 (*)    Neutro Abs 10.4 (*)    All other components within normal limits  COMPREHENSIVE METABOLIC PANEL - Abnormal; Notable for the following:    Glucose, Bld 110 (*)    GFR calc non Af Amer 84 (*)    All other components within normal limits  LIPASE, BLOOD   Ct Abdomen Pelvis W Contrast  01/01/2013   *RADIOLOGY REPORT*  Clinical Data: Cough, nausea, body aches, fever, recent pneumonia, history Crohn's disease, hypertension, asthma  CT ABDOMEN AND PELVIS WITH CONTRAST  Technique:  Multidetector CT imaging of the abdomen and pelvis was performed following the standard protocol during bolus administration of intravenous contrast. Sagittal and coronal MPR images reconstructed from axial data set.  Contrast: 50mL OMNIPAQUE IOHEXOL 300 MG/ML  SOLN, OMNIPAQUE IOHEXOL 300 MG/ML  SOLN  Comparison: 07/23/2008  Findings: Lung bases clear. Liver, spleen, pancreas, kidneys, and adrenal glands normal appearance. Scattered diverticula throughout colon without evidence of diverticulitis. Stomach and bowel loops otherwise normal appearance. Normal appendix. No mass, adenopathy, free fluid inflammatory process. Uterus surgically absent with unremarkable ovaries and bladder noted. No acute osseous findings.  IMPRESSION: Scattered colonic diverticulosis without evidence of acute diverticulitis. Otherwise negative exam.   Original Report Authenticated By: Ulyses Southward, M.D.   Dg Abd Acute W/chest  01/01/2013   *RADIOLOGY REPORT*  Clinical Data: Short of breath, abdominal pain  ACUTE ABDOMEN SERIES (ABDOMEN 2 VIEW & CHEST 1 VIEW)  Comparison: Prior chest x-ray 02/11/2012; prior CT of the abdomen 07/23/2008  Findings: The lungs are clear.  Cardiac and mediastinal contours within  normal limits.  No acute osseous abnormality.  Nonobstructed bowel gas pattern.  Gas noted throughout the colon to the rectum.  No free air on the upright view.  No organomegaly.  No dilated loops of small bowel.  IMPRESSION:  1.  No acute cardiopulmonary disease. 2.  Nonobstructed bowel gas pattern.  Gas is noted throughout the colon to the level of the rectum.   Original Report Authenticated By: Malachy Moan, M.D.   1. Abdominal pain     MDM    Benny Lennert, MD 01/02/13 [phone]

## 2013-09-07 ENCOUNTER — Emergency Department (HOSPITAL_COMMUNITY): Payer: Medicaid Other

## 2013-09-07 ENCOUNTER — Emergency Department (HOSPITAL_COMMUNITY)
Admission: EM | Admit: 2013-09-07 | Discharge: 2013-09-07 | Disposition: A | Discharge status: Home / Self Care | Payer: Medicaid Other | Attending: Emergency Medicine | Admitting: Emergency Medicine

## 2013-09-07 ENCOUNTER — Encounter (HOSPITAL_COMMUNITY): Payer: Self-pay | Admitting: Emergency Medicine

## 2013-09-07 DIAGNOSIS — Z8701 Personal history of pneumonia (recurrent): Secondary | ICD-10-CM | POA: Insufficient documentation

## 2013-09-07 DIAGNOSIS — J209 Acute bronchitis, unspecified: Secondary | ICD-10-CM

## 2013-09-07 DIAGNOSIS — Z8719 Personal history of other diseases of the digestive system: Secondary | ICD-10-CM | POA: Insufficient documentation

## 2013-09-07 DIAGNOSIS — Z79899 Other long term (current) drug therapy: Secondary | ICD-10-CM | POA: Insufficient documentation

## 2013-09-07 DIAGNOSIS — Z8659 Personal history of other mental and behavioral disorders: Secondary | ICD-10-CM | POA: Insufficient documentation

## 2013-09-07 DIAGNOSIS — M549 Dorsalgia, unspecified: Secondary | ICD-10-CM | POA: Insufficient documentation

## 2013-09-07 DIAGNOSIS — J45901 Unspecified asthma with (acute) exacerbation: Secondary | ICD-10-CM | POA: Insufficient documentation

## 2013-09-07 DIAGNOSIS — I10 Essential (primary) hypertension: Secondary | ICD-10-CM | POA: Insufficient documentation

## 2013-09-07 DIAGNOSIS — F17200 Nicotine dependence, unspecified, uncomplicated: Secondary | ICD-10-CM | POA: Insufficient documentation

## 2013-09-07 MED ORDER — ALBUTEROL SULFATE HFA 108 (90 BASE) MCG/ACT IN AERS: 2.0000 | INHALATION_SPRAY | RESPIRATORY_TRACT | Status: DC | PRN

## 2013-09-07 MED ORDER — PREDNISONE 10 MG PO TABS: ORAL_TABLET | ORAL | Status: DC

## 2013-09-07 MED ORDER — ALBUTEROL SULFATE (2.5 MG/3ML) 0.083% IN NEBU
2.5000 mg | INHALATION_SOLUTION | Freq: Once | RESPIRATORY_TRACT | Status: AC
  Administered 2013-09-07: 2.5 mg via RESPIRATORY_TRACT
  Filled 2013-09-07: qty 3

## 2013-09-07 MED ORDER — HYDROCODONE-ACETAMINOPHEN 5-325 MG PO TABS: ORAL_TABLET | ORAL | Status: DC

## 2013-09-07 MED ORDER — PREDNISONE 50 MG PO TABS
60.0000 mg | ORAL_TABLET | Freq: Once | ORAL | Status: AC
  Administered 2013-09-07: 60 mg via ORAL
  Filled 2013-09-07 (×2): qty 1

## 2013-09-07 MED ORDER — HYDROCODONE-ACETAMINOPHEN 5-325 MG PO TABS
1.0000 | ORAL_TABLET | Freq: Once | ORAL | Status: AC
  Administered 2013-09-07: 1 via ORAL
  Filled 2013-09-07: qty 1

## 2013-09-07 MED ORDER — AZITHROMYCIN 250 MG PO TABS: ORAL_TABLET | ORAL | Status: DC

## 2013-09-07 MED ORDER — ALBUTEROL SULFATE (2.5 MG/3ML) 0.083% IN NEBU
5.0000 mg | INHALATION_SOLUTION | Freq: Once | RESPIRATORY_TRACT | Status: AC
  Administered 2013-09-07: 5 mg via RESPIRATORY_TRACT
  Filled 2013-09-07: qty 6

## 2013-09-07 NOTE — Discharge Instructions (Signed)
Bronchitis °Bronchitis is swelling (inflammation) of the air tubes leading to your lungs (bronchi). This causes mucus and a cough. If the swelling gets bad, you may have trouble breathing. °HOME CARE  °· Rest. °· Drink enough fluids to keep your pee (urine) clear or pale yellow (unless you have a condition where you have to watch how much you drink). °· Only take medicine as told by your doctor. If you were given antibiotic medicines, finish them even if you start to feel better. °· Avoid smoke, irritating chemicals, and strong smells. These make the problem worse. Quit smoking if you smoke. This helps your lungs heal faster. °· Use a cool mist humidifier. Change the water in the humidifier every day. You can also sit in the bathroom with hot shower running for 5 10 minutes. Keep the door closed. °· See your health care provider as told. °· Wash your hands often. °GET HELP IF: °Your problems do not get better after 1 week. °GET HELP RIGHT AWAY IF:  °· Your fever gets worse. °· You have chills. °· Your chest hurts. °· Your problems breathing get worse. °· You have blood in your mucus. °· You pass out (faint). °· You feel lightheaded. °· You have a bad headache. °· You throw up (vomit) again and again. °MAKE SURE YOU: °· Understand these instructions. °· Will watch your condition. °· Will get help right away if you are not doing well or get worse. °Document Released: 12/03/2007 Document Revised: 04/06/2013 Document Reviewed: 02/08/2013 °ExitCare® Patient Information ©2014 [employer]. ° °

## 2013-09-07 NOTE — ED Notes (Signed)
RT paged.

## 2013-09-07 NOTE — ED Notes (Signed)
Patient c/o productive cough with thick green sputum, fever, back pain/generalized body aches, nausea, and vomiting since Thursday. Per patient no nausea, vomiting, or diarrhea since Sunday.

## 2013-09-07 NOTE — ED Provider Notes (Signed)
CSN: [id number]     Arrival date & time 09/07/13  4174 History   First MD Initiated Contact with Patient 09/07/13 [phone]     Chief Complaint  Patient presents with  . Cough  . Generalized Body Aches     (Consider location/radiation/quality/duration/timing/severity/associated sxs/prior Treatment) Patient is a 43 y.o. female presenting with cough. The history is provided by the patient.  Cough Cough characteristics:  Productive Sputum characteristics:  Green and yellow Severity:  Moderate Onset quality:  Gradual Duration:  6 days Timing:  Constant Progression:  Unchanged Chronicity:  New Smoker: yes   Context: upper respiratory infection   Relieved by:  Nothing Worsened by:  Deep breathing Ineffective treatments:  None tried Associated symptoms: chest pain, fever, myalgias, shortness of breath, sinus congestion and wheezing   Associated symptoms: no chills, no diaphoresis, no ear pain, no headaches, no rash, no rhinorrhea and no sore throat    Patient with hx of PNA 2 years ago, c/o productive cough, back pain, generalized body aches, and fever that began 6 days ago. She reports nausea vomiting and diarrhea for several days which has now resolved. Tolerating foods and fluids without difficulty. Patient uses albuterol inhaler without relief.  She reports some shortness of breath with exertion.     Past Medical History  Diagnosis Date  . Pneumonia   . Depression   . Crohn's disease   . Hypertension   . Asthma    Past Surgical History  Procedure Laterality Date  . Abdominal hysterectomy    . Fracture surgery     Family History  Problem Relation Age of Onset  . Cancer Other    History  Substance Use Topics  . Smoking status: Current Every Day Smoker -- 1.00 packs/day for 25 years    Types: Cigarettes  . Smokeless tobacco: Never Used  . Alcohol Use: No   OB History   Grav Para Term Preterm Abortions TAB SAB Ect Mult Living   3 3 3       3      Review of Systems   Constitutional: Positive for fever. Negative for chills, diaphoresis, activity change and appetite change.  HENT: Positive for congestion. Negative for ear pain, facial swelling, rhinorrhea, sore throat and trouble swallowing.   Eyes: Negative for visual disturbance.  Respiratory: Positive for cough, chest tightness, shortness of breath and wheezing. Negative for stridor.   Cardiovascular: Positive for chest pain.  Gastrointestinal: Negative for nausea, vomiting, abdominal pain and diarrhea.  Genitourinary: Negative for dysuria, urgency, flank pain and difficulty urinating.  Musculoskeletal: Positive for back pain and myalgias. Negative for arthralgias, neck pain and neck stiffness.  Skin: Negative.  Negative for rash.  Neurological: Negative for dizziness, syncope, speech difficulty, weakness, numbness and headaches.  Hematological: Negative for adenopathy.  Psychiatric/Behavioral: Negative for confusion.  All other systems reviewed and are negative.      Allergies  Bee venom; Sulfa antibiotics; and Voltaren  Home Medications   Current Outpatient Rx  Name  Route  Sig  Dispense  Refill  . albuterol (PROAIR HFA) 108 (90 BASE) MCG/ACT inhaler   Inhalation   Inhale 2 puffs into the lungs every 6 (six) hours as needed for wheezing or shortness of breath.   3.7 g   0   . HYDROcodone-acetaminophen (NORCO/VICODIN) 5-325 MG per tablet   Oral   Take 1 tablet by mouth every 6 (six) hours as needed for pain.   20 tablet   0   . ibuprofen (ADVIL,MOTRIN)  200 MG tablet   Oral   Take 3 tablets (600 mg total) by mouth every 6 (six) hours as needed for pain or headache.   90 tablet   0   . omeprazole (PRILOSEC) 20 MG capsule   Oral   Take 1 capsule (20 mg total) by mouth 2 (two) times daily. For control of stomach acid secretion and helps GERD.   60 capsule   0   . promethazine (PHENERGAN) 25 MG tablet   Oral   Take 1 tablet (25 mg total) by mouth every 6 (six) hours as needed for  nausea.   15 tablet   0   . EXPIRED: propranolol (INDERAL) 10 MG tablet   Oral   Take 1 tablet (10 mg total) by mouth 3 (three) times daily. For anxiety   90 tablet   0    BP 136/81  Pulse 73  Temp(Src) 98.1 F (36.7 C) (Oral)  Resp 20  Ht 5\' 6"  (1.676 m)  Wt 170 lb (77.111 kg)  BMI 27.45 kg/m2  SpO2 98% Physical Exam  Nursing note and vitals reviewed. Constitutional: She is oriented to person, place, and time. She appears well-developed and well-nourished. No distress.  HENT:  Head: Normocephalic and atraumatic.  Right Ear: Tympanic membrane and ear canal normal.  Left Ear: Tympanic membrane and ear canal normal.  Mouth/Throat: Uvula is midline, oropharynx is clear and moist and mucous membranes are normal. No oropharyngeal exudate.  Eyes: EOM are normal. Pupils are equal, round, and reactive to light.  Neck: Normal range of motion, full passive range of motion without pain and phonation normal. Neck supple.  Cardiovascular: Normal rate, regular rhythm, normal heart sounds and intact distal pulses.   No murmur heard. Pulmonary/Chest: Effort normal. No stridor. No respiratory distress. She has wheezes. She has no rales. She exhibits no tenderness.  Coarse lungs sounds bilaterally with expiratory wheezes  present. No rales  Abdominal: Soft. She exhibits no distension and no mass. There is no tenderness. There is no rebound and no guarding.  Musculoskeletal: Normal range of motion. She exhibits no edema.  Lymphadenopathy:    She has no cervical adenopathy.  Neurological: She is alert and oriented to person, place, and time. She exhibits normal muscle tone. Coordination normal.  Skin: Skin is warm and dry.    ED Course  Procedures (including critical care time) Labs Review Labs Reviewed - No data to display Imaging Review Dg Chest 2 View  09/07/2013   CLINICAL DATA Fever, cough  EXAM CHEST  2 VIEW  COMPARISON 01/01/2013  FINDINGS The heart size and mediastinal contours  are within normal limits. Both lungs are clear. The visualized skeletal structures are unremarkable.  IMPRESSION No active cardiopulmonary disease.  SIGNATURE  Electronically Signed   By: Inez Catalina M.D.   On: 09/07/2013 10:11     EKG Interpretation None      MDM   Final diagnoses:  Bronchospasm with bronchitis, acute    CXR reviewed and discussed with patient.  VSS.  No tachycardia , hypoxia or tachypnea.  Lung sounds greatly improved after nebs and prednisone.  Pt is feeling better and requesting discharge.  No concerning sx's for PE, Wells PE score is 0.   She agrees to close PMD f/u or to return here if her sx's worsen.  She appears stable for discharge.    The patient appears reasonably screened and/or stabilized for discharge and I doubt any other medical condition or other [facility] requiring further  screening, evaluation, or treatment in the ED at this time prior to discharge.   Kendricks Reap L. Vanessa Curlew, PA-C 09/08/13 2058

## 2013-09-13 NOTE — ED Provider Notes (Signed)
Medical screening examination/treatment/procedure(s) were performed by non-physician practitioner and as supervising physician I was immediately available for consultation/collaboration.   EKG Interpretation None        Mervin Kung, MD 09/13/13 1409

## 2014-05-01 ENCOUNTER — Encounter (HOSPITAL_COMMUNITY): Payer: Self-pay | Admitting: Emergency Medicine

## 2015-07-01 DIAGNOSIS — Z86711 Personal history of pulmonary embolism: Secondary | ICD-10-CM

## 2015-07-01 HISTORY — DX: Personal history of pulmonary embolism: Z86.711

## 2015-09-12 ENCOUNTER — Encounter (INDEPENDENT_AMBULATORY_CARE_PROVIDER_SITE_OTHER): Payer: Self-pay | Admitting: *Deleted

## 2015-10-02 ENCOUNTER — Ambulatory Visit (INDEPENDENT_AMBULATORY_CARE_PROVIDER_SITE_OTHER): Payer: Medicaid Other | Admitting: Internal Medicine

## 2015-10-02 ENCOUNTER — Encounter (INDEPENDENT_AMBULATORY_CARE_PROVIDER_SITE_OTHER): Payer: Self-pay | Admitting: Internal Medicine

## 2015-10-02 ENCOUNTER — Encounter (INDEPENDENT_AMBULATORY_CARE_PROVIDER_SITE_OTHER): Payer: Self-pay

## 2015-10-02 DIAGNOSIS — K5732 Diverticulitis of large intestine without perforation or abscess without bleeding: Secondary | ICD-10-CM

## 2015-10-02 DIAGNOSIS — K858 Other acute pancreatitis without necrosis or infection: Secondary | ICD-10-CM

## 2015-10-02 DIAGNOSIS — K2100 Gastro-esophageal reflux disease with esophagitis, without bleeding: Secondary | ICD-10-CM

## 2015-10-02 DIAGNOSIS — K859 Acute pancreatitis without necrosis or infection, unspecified: Secondary | ICD-10-CM | POA: Insufficient documentation

## 2015-10-02 DIAGNOSIS — K5000 Crohn's disease of small intestine without complications: Secondary | ICD-10-CM

## 2015-10-02 DIAGNOSIS — I10 Essential (primary) hypertension: Secondary | ICD-10-CM | POA: Insufficient documentation

## 2015-10-02 DIAGNOSIS — R1031 Right lower quadrant pain: Secondary | ICD-10-CM | POA: Diagnosis not present

## 2015-10-02 LAB — CBC WITH DIFFERENTIAL/PLATELET
Basophils Absolute: 0 cells/uL (ref 0–200)
Basophils Relative: 0 %
Eosinophils Absolute: 320 cells/uL (ref 15–500)
Eosinophils Relative: 5 %
HEMATOCRIT: 44.2 % (ref 35.0–45.0)
Hemoglobin: 14.7 g/dL (ref 11.7–15.5)
LYMPHS ABS: 1984 {cells}/uL (ref 850–3900)
LYMPHS PCT: 31 %
MCH: 30.2 pg (ref 27.0–33.0)
MCHC: 33.3 g/dL (ref 32.0–36.0)
MCV: 90.8 fL (ref 80.0–100.0)
MONO ABS: 512 {cells}/uL (ref 200–950)
MPV: 10.8 fL (ref 7.5–12.5)
Monocytes Relative: 8 %
Neutro Abs: 3584 cells/uL (ref 1500–7800)
Neutrophils Relative %: 56 %
PLATELETS: 261 10*3/uL (ref 140–400)
RBC: 4.87 MIL/uL (ref 3.80–5.10)
RDW: 14.2 % (ref 11.0–15.0)
WBC: 6.4 10*3/uL (ref 3.8–10.8)

## 2015-10-02 MED ORDER — PANTOPRAZOLE SODIUM 20 MG PO TBEC: 20.0000 mg | DELAYED_RELEASE_TABLET | Freq: Every day | ORAL | Status: DC

## 2015-10-02 NOTE — Progress Notes (Addendum)
Subjective:    Patient ID: Catherine Costa, female    DOB: 1971/05/05, 45 y.o.   MRN: [id number]  HPI Referred by Dr. Quintin Alto for heartburn/nausea. She tells me she has heartburn daily. The Prilosec helps but she does have breakthru. She tells me she may vomit once or twice a week.  She has diarrhea 3-4 days a week. She does not take anything for her diarrhea. Hx of Crohn's and diagnosed in 1993 at [facility].   Her last colonoscopy was ? In 1995. She has not taken anything for her Crohn's disease in 10 yrs due to the side effects. She has taken Prednisone years ago for flares.   She says she was recently admitted to [facility] with sigmoid diverticulitis. She says she has had several bouts of diverticulitis in the past. She tells me today she still has LLQ pain. Has not stopped since her discharge in January.  Just recently finished ? Antibiotics.  Admitted to [facility] with pancreatitis and sigmoid diverticulitis.  Hx of pancreatitis and sigmoid diverticulitis, Hx of recent PE,in January and maintained on Xarelto    07/24/2015 H and H 14.7 and 43.9  07/11/2015: US abdomen: abdominal pain: Echogenic liver consistent with fatty infiltration or hepatocellular diseae. No evidence of gallstone or biliary distention. CBD 5.2mm.  1/10/29167 CT abdomen/pelvis with CM: N,V, elevated lipase: Imaging findings suspicious for mild diverticulitis, involving the sigmoid colon. No abscess or bowel perforation. Left adrenal nodule, likely benign adrenal adenoma. L5-S1 degerative disc disease.  Echo: 60-65% Per records: Chest CT showed small rt upper lobe segmental branch pulmonary artery embolus.   Review of Systems Past Medical History  Diagnosis Date  . Pneumonia   . Depression   . Crohn's disease ([place])   . Hypertension   . Asthma   . Crohn's colitis [facility])     Past Surgical History  Procedure Laterality Date  . Abdominal hysterectomy    . Fracture surgery      Allergies  Allergen Reactions  . Bee Venom  Anaphylaxis  . Sulfa Antibiotics Other (See Comments)    REACTION: Affects Colon  . Voltaren [Diclofenac Sodium] Other (See Comments)    REACTION: Affects Colon    Current Outpatient Prescriptions on File Prior to Visit  Medication Sig Dispense Refill  . albuterol (PROAIR HFA) 108 (90 BASE) MCG/ACT inhaler Inhale 2 puffs into the lungs every 6 (six) hours as needed for wheezing or shortness of breath. 3.7 g 0  . albuterol (PROVENTIL HFA;VENTOLIN HFA) 108 (90 BASE) MCG/ACT inhaler Inhale 2 puffs into the lungs every 4 (four) hours as needed for wheezing or shortness of breath. 1 Inhaler 0  . ibuprofen (ADVIL,MOTRIN) 800 MG tablet Take 800 mg by mouth daily as needed for mild pain.    Marland Kitchen omeprazole (PRILOSEC) 20 MG capsule Take 1 capsule (20 mg total) by mouth 2 (two) times daily. For control of stomach acid secretion and helps GERD. 60 capsule 0  . propranolol (INDERAL) 10 MG tablet Take 1 tablet (10 mg total) by mouth 3 (three) times daily. For anxiety 90 tablet 0   No current facility-administered medications on file prior to visit.        Objective:   Physical ExamBlood pressure 140/70, pulse 64, temperature 98 F (36.7 C), height 5\' 6"  (1.676 m), weight 182 lb 9.6 oz (82.827 kg). Alert and oriented. Skin warm and dry. Oral mucosa is moist.   . Sclera anicteric, conjunctivae is pink. Thyroid not enlarged. No cervical lymphadenopathy. Lungs clear.  Heart regular rate and rhythm.  Abdomen is soft. Bowel sounds are positive. No hepatomegaly. No abdominal masses felt. Epigastric and left lower quadrant tenderness.   No edema to lower extremities.          Assessment & Plan:  Sigmoid diverticulitis: Am going to repeat a CT scan of the abdomen. Pancreatitis: No pain in her left upper quadrant at this time. CT scan of the abdomen has been ordered.  GERD: Am going to start her on Protonix 40mg  BID. Will need a colonoscopy once I have CT report back. (with Propofol). Patient states she is  hard to sedate. Crohn's:disease:  CBC, Hepatic. CRP. Further recommendations to follow.

## 2015-10-02 NOTE — Patient Instructions (Signed)
Labs, CT report.

## 2015-10-03 LAB — HEPATIC FUNCTION PANEL
ALT: 15 U/L (ref 6–29)
AST: 15 U/L (ref 10–35)
Albumin: 4 g/dL (ref 3.6–5.1)
Alkaline Phosphatase: 80 U/L (ref 33–115)
BILIRUBIN DIRECT: 0.1 mg/dL (ref <=0.2)
Indirect Bilirubin: 0.3 mg/dL (ref 0.2–1.2)
Total Bilirubin: 0.4 mg/dL (ref 0.2–1.2)
Total Protein: 6.6 g/dL (ref 6.1–8.1)

## 2015-10-03 LAB — C-REACTIVE PROTEIN: CRP: 0.5 mg/dL (ref <0.60)

## 2015-10-04 ENCOUNTER — Ambulatory Visit (HOSPITAL_COMMUNITY)
Admission: RE | Admit: 2015-10-04 | Discharge: 2015-10-04 | Disposition: A | Discharge status: Home / Self Care | Payer: Medicaid Other | Source: Ambulatory Visit | Attending: Internal Medicine | Admitting: Internal Medicine

## 2015-10-04 DIAGNOSIS — R1031 Right lower quadrant pain: Secondary | ICD-10-CM

## 2015-10-04 DIAGNOSIS — K439 Ventral hernia without obstruction or gangrene: Secondary | ICD-10-CM | POA: Insufficient documentation

## 2015-10-04 DIAGNOSIS — K5730 Diverticulosis of large intestine without perforation or abscess without bleeding: Secondary | ICD-10-CM | POA: Insufficient documentation

## 2015-10-04 MED ORDER — IOPAMIDOL (ISOVUE-300) INJECTION 61%
100.0000 mL | Freq: Once | INTRAVENOUS | Status: AC | PRN
  Administered 2015-10-04: 100 mL via INTRAVENOUS

## 2015-10-08 ENCOUNTER — Telehealth (INDEPENDENT_AMBULATORY_CARE_PROVIDER_SITE_OTHER): Payer: Self-pay | Admitting: *Deleted

## 2015-10-08 ENCOUNTER — Other Ambulatory Visit (INDEPENDENT_AMBULATORY_CARE_PROVIDER_SITE_OTHER): Payer: Self-pay | Admitting: Internal Medicine

## 2015-10-08 DIAGNOSIS — K5010 Crohn's disease of large intestine without complications: Secondary | ICD-10-CM

## 2015-10-08 NOTE — Telephone Encounter (Signed)
Results given to patient. Will be scheduled for a colonoscopy

## 2015-10-08 NOTE — Telephone Encounter (Signed)
Patient returned a call for results.  No phone number left.  Hope

## 2015-10-09 ENCOUNTER — Telehealth (INDEPENDENT_AMBULATORY_CARE_PROVIDER_SITE_OTHER): Payer: Self-pay | Admitting: *Deleted

## 2015-10-09 ENCOUNTER — Encounter (INDEPENDENT_AMBULATORY_CARE_PROVIDER_SITE_OTHER): Payer: Self-pay | Admitting: *Deleted

## 2015-10-09 ENCOUNTER — Other Ambulatory Visit (INDEPENDENT_AMBULATORY_CARE_PROVIDER_SITE_OTHER): Payer: Self-pay | Admitting: *Deleted

## 2015-10-09 MED ORDER — PEG 3350-KCL-NA BICARB-NACL 420 G PO SOLR: 4000.0000 mL | Freq: Once | ORAL | Status: DC

## 2015-10-09 NOTE — Telephone Encounter (Signed)
Per Stefani Dama with Dr Quintin Alto it is ok for patient to hold Xarelto 2 days prior to TCS sch'd 11/09/15, patient aware

## 2015-10-09 NOTE — Telephone Encounter (Signed)
Needs trilyte 

## 2015-11-05 NOTE — Patient Instructions (Signed)
Catherine Costa  11/05/2015     @PREFPERIOPPHARMACY @   Your procedure is scheduled on   11/09/2015   Report to [facility] at  46  A.M.  Call this number if you have problems the morning of surgery:  [phone]   Remember:  Do not eat food or drink liquids after midnight.  Take these medicines the morning of surgery with A SIP OF WATER  Celexa, klonopin, protonix, zantac. Use your inhaler before you come and bring it with you. DO NOT take any Xarelto after 11/06/2015.    Do not wear jewelry, make-up or nail polish.  Do not wear lotions, powders, or perfumes.  You may wear deodorant.  Do not shave 48 hours prior to surgery.  Men may shave face and neck.  Do not bring valuables to the hospital.  [facility] is not responsible for any belongings or valuables.  Contacts, dentures or bridgework may not be worn into surgery.  Leave your suitcase in the car.  After surgery it may be brought to your room.  For patients admitted to the hospital, discharge time will be determined by your treatment team.  Patients discharged the day of surgery will not be allowed to drive home.   Name and phone number of your driver:   family Special instructions:  Follow the diet and prep instructions given to you by Dr Olevia Perches office.  Please read over the following fact sheets that you were given. Coughing and Deep Breathing, Surgical Site Infection Prevention, Anesthesia Post-op Instructions and Care and Recovery After Surgery      Colonoscopy A colonoscopy is an exam to look at the entire large intestine (colon). This exam can help find problems such as tumors, polyps, inflammation, and areas of bleeding. The exam takes about 1 hour.  LET [facility] CARE PROVIDER KNOW ABOUT:   Any allergies you have.  All medicines you are taking, including vitamins, herbs, eye drops, creams, and over-the-counter medicines.  Previous problems you or members of your family have had with the use of  anesthetics.  Any blood disorders you have.  Previous surgeries you have had.  Medical conditions you have. RISKS AND COMPLICATIONS  Generally, this is a safe procedure. However, as with any procedure, complications can occur. Possible complications include:  Bleeding.  Tearing or rupture of the colon wall.  Reaction to medicines given during the exam.  Infection (rare). BEFORE THE PROCEDURE   Ask your health care provider about changing or stopping your regular medicines.  You may be prescribed an oral bowel prep. This involves drinking a large amount of medicated liquid, starting the day before your procedure. The liquid will cause you to have multiple loose stools until your stool is almost clear or light green. This cleans out your colon in preparation for the procedure.  Do not eat or drink anything else once you have started the bowel prep, unless your health care provider tells you it is safe to do so.  Arrange for someone to drive you home after the procedure. PROCEDURE   You will be given medicine to help you relax (sedative).  You will lie on your side with your knees bent.  A long, flexible tube with a light and camera on the end (colonoscope) will be inserted through the rectum and into the colon. The camera sends video back to a computer screen as it moves through the colon. The colonoscope also releases carbon dioxide  gas to inflate the colon. This helps your health care provider see the area better.  During the exam, your health care provider may take a small tissue sample (biopsy) to be examined under a microscope if any abnormalities are found.  The exam is finished when the entire colon has been viewed. AFTER THE PROCEDURE   Do not drive for 24 hours after the exam.  You may have a small amount of blood in your stool.  You may pass moderate amounts of gas and have mild abdominal cramping or bloating. This is caused by the gas used to inflate your colon  during the exam.  Ask when your test results will be ready and how you will get your results. Make sure you get your test results.   This information is not intended to replace advice given to you by your health care provider. Make sure you discuss any questions you have with your health care provider.   Document Released: 06/13/2000 Document Revised: 04/06/2013 Document Reviewed: 02/21/2013 Elsevier Interactive Patient Education 2016 [employer] Colonoscopy, Care After Refer to this sheet in the next few weeks. These instructions provide you with information on caring for yourself after your procedure. Your health care provider may also give you more specific instructions. Your treatment has been planned according to current medical practices, but problems sometimes occur. Call your health care provider if you have any problems or questions after your procedure. WHAT TO EXPECT AFTER THE PROCEDURE  After your procedure, it is typical to have the following:  A small amount of blood in your stool.  Moderate amounts of gas and mild abdominal cramping or bloating. HOME CARE INSTRUCTIONS  Do not drive, operate machinery, or sign important documents for 24 hours.  You may shower and resume your regular physical activities, but move at a slower pace for the first 24 hours.  Take frequent rest periods for the first 24 hours.  Walk around or put a warm pack on your abdomen to help reduce abdominal cramping and bloating.  Drink enough fluids to keep your urine clear or pale yellow.  You may resume your normal diet as instructed by your health care provider. Avoid heavy or fried foods that are hard to digest.  Avoid drinking alcohol for 24 hours or as instructed by your health care provider.  Only take over-the-counter or prescription medicines as directed by your health care provider.  If a tissue sample (biopsy) was taken during your procedure:  Do not take aspirin or blood thinners for  7 days, or as instructed by your health care provider.  Do not drink alcohol for 7 days, or as instructed by your health care provider.  Eat soft foods for the first 24 hours. SEEK MEDICAL CARE IF: You have persistent spotting of blood in your stool 2-3 days after the procedure. SEEK IMMEDIATE MEDICAL CARE IF:  You have more than a small spotting of blood in your stool.  You pass large blood clots in your stool.  Your abdomen is swollen (distended).  You have nausea or vomiting.  You have a fever.  You have increasing abdominal pain that is not relieved with medicine.   This information is not intended to replace advice given to you by your health care provider. Make sure you discuss any questions you have with your health care provider.   Document Released: 01/29/2004 Document Revised: 04/06/2013 Document Reviewed: 02/21/2013 Elsevier Interactive Patient Education 2016 [employer] PATIENT INSTRUCTIONS POST-ANESTHESIA  IMMEDIATELY FOLLOWING SURGERY:  Do not drive or operate machinery for the first twenty four hours after surgery.  Do not make any important decisions for twenty four hours after surgery or while taking narcotic pain medications or sedatives.  If you develop intractable nausea and vomiting or a severe headache please notify your doctor immediately.  FOLLOW-UP:  Please make an appointment with your surgeon as instructed. You do not need to follow up with anesthesia unless specifically instructed to do so.  WOUND CARE INSTRUCTIONS (if applicable):  Keep a dry clean dressing on the anesthesia/puncture wound site if there is drainage.  Once the wound has quit draining you may leave it open to air.  Generally you should leave the bandage intact for twenty four hours unless there is drainage.  If the epidural site drains for more than 36-48 hours please call the anesthesia department.  QUESTIONS?:  Please feel free to call your physician or the hospital operator if you  have any questions, and they will be happy to assist you.

## 2015-11-06 ENCOUNTER — Encounter (HOSPITAL_COMMUNITY)
Admission: RE | Admit: 2015-11-06 | Discharge: 2015-11-06 | Disposition: A | Discharge status: Home / Self Care | Payer: Medicaid Other | Source: Ambulatory Visit | Attending: Internal Medicine | Admitting: Internal Medicine

## 2015-11-07 ENCOUNTER — Encounter (HOSPITAL_COMMUNITY): Payer: Self-pay

## 2015-11-07 ENCOUNTER — Other Ambulatory Visit: Payer: Self-pay

## 2015-11-07 ENCOUNTER — Encounter (HOSPITAL_COMMUNITY)
Admission: RE | Admit: 2015-11-07 | Discharge: 2015-11-07 | Disposition: A | Discharge status: Home / Self Care | Payer: Medicaid Other | Source: Ambulatory Visit | Attending: Internal Medicine | Admitting: Internal Medicine

## 2015-11-07 VITALS — BP 110/74 | HR 89 | Temp 98.9°F | Resp 18 | Ht 66.0 in | Wt 188.0 lb

## 2015-11-07 DIAGNOSIS — K5010 Crohn's disease of large intestine without complications: Secondary | ICD-10-CM

## 2015-11-07 DIAGNOSIS — Z01810 Encounter for preprocedural cardiovascular examination: Secondary | ICD-10-CM | POA: Diagnosis present

## 2015-11-07 DIAGNOSIS — Z01812 Encounter for preprocedural laboratory examination: Secondary | ICD-10-CM | POA: Insufficient documentation

## 2015-11-07 HISTORY — DX: Gastro-esophageal reflux disease without esophagitis: K21.9

## 2015-11-07 HISTORY — DX: Adverse effect of unspecified anesthetic, initial encounter: T41.45XA

## 2015-11-07 HISTORY — DX: Personal history of pulmonary embolism: Z86.711

## 2015-11-07 HISTORY — DX: Sleep apnea, unspecified: G47.30

## 2015-11-07 LAB — CBC WITH DIFFERENTIAL/PLATELET
Basophils Absolute: 0 10*3/uL (ref 0.0–0.1)
Basophils Relative: 0 %
EOS ABS: 0.4 10*3/uL (ref 0.0–0.7)
EOS PCT: 5 %
HCT: 42.1 % (ref 36.0–46.0)
Hemoglobin: 14 g/dL (ref 12.0–15.0)
LYMPHS ABS: 2.6 10*3/uL (ref 0.7–4.0)
Lymphocytes Relative: 32 %
MCH: 31.4 pg (ref 26.0–34.0)
MCHC: 33.3 g/dL (ref 30.0–36.0)
MCV: 94.4 fL (ref 78.0–100.0)
MONO ABS: 0.5 10*3/uL (ref 0.1–1.0)
Monocytes Relative: 6 %
Neutro Abs: 4.5 10*3/uL (ref 1.7–7.7)
Neutrophils Relative %: 57 %
PLATELETS: 217 10*3/uL (ref 150–400)
RBC: 4.46 MIL/uL (ref 3.87–5.11)
RDW: 13.4 % (ref 11.5–15.5)
WBC: 8 10*3/uL (ref 4.0–10.5)

## 2015-11-07 LAB — BASIC METABOLIC PANEL
Anion gap: 7 (ref 5–15)
BUN: 13 mg/dL (ref 6–20)
CHLORIDE: 107 mmol/L (ref 101–111)
CO2: 23 mmol/L (ref 22–32)
Calcium: 9.1 mg/dL (ref 8.9–10.3)
Creatinine, Ser: 0.87 mg/dL (ref 0.44–1.00)
GFR calc Af Amer: >60 mL/min (ref >60)
GFR calc non Af Amer: >60 mL/min (ref >60)
GLUCOSE: 107 mg/dL — AB (ref 65–99)
Potassium: 4 mmol/L (ref 3.5–5.1)
Sodium: 137 mmol/L (ref 135–145)

## 2015-11-07 NOTE — Pre-Procedure Instructions (Signed)
Patient given information to sign up for my chart at home. 

## 2015-11-09 ENCOUNTER — Ambulatory Visit (HOSPITAL_COMMUNITY)
Admission: RE | Admit: 2015-11-09 | Discharge: 2015-11-09 | Disposition: A | Discharge status: Home / Self Care | Payer: Medicaid Other | Source: Ambulatory Visit | Attending: Internal Medicine | Admitting: Internal Medicine

## 2015-11-09 ENCOUNTER — Encounter (HOSPITAL_COMMUNITY)
Admission: RE | Disposition: A | Discharge status: Home / Self Care | Payer: Self-pay | Source: Ambulatory Visit | Attending: Internal Medicine

## 2015-11-09 ENCOUNTER — Ambulatory Visit (HOSPITAL_COMMUNITY): Payer: Medicaid Other | Admitting: Anesthesiology

## 2015-11-09 ENCOUNTER — Encounter (HOSPITAL_COMMUNITY): Payer: Self-pay | Admitting: *Deleted

## 2015-11-09 DIAGNOSIS — D120 Benign neoplasm of cecum: Secondary | ICD-10-CM | POA: Diagnosis not present

## 2015-11-09 DIAGNOSIS — I10 Essential (primary) hypertension: Secondary | ICD-10-CM | POA: Insufficient documentation

## 2015-11-09 DIAGNOSIS — J45909 Unspecified asthma, uncomplicated: Secondary | ICD-10-CM | POA: Insufficient documentation

## 2015-11-09 DIAGNOSIS — K5010 Crohn's disease of large intestine without complications: Secondary | ICD-10-CM

## 2015-11-09 DIAGNOSIS — G4730 Sleep apnea, unspecified: Secondary | ICD-10-CM | POA: Diagnosis not present

## 2015-11-09 DIAGNOSIS — K644 Residual hemorrhoidal skin tags: Secondary | ICD-10-CM | POA: Diagnosis not present

## 2015-11-09 DIAGNOSIS — F17210 Nicotine dependence, cigarettes, uncomplicated: Secondary | ICD-10-CM | POA: Diagnosis not present

## 2015-11-09 DIAGNOSIS — Z7901 Long term (current) use of anticoagulants: Secondary | ICD-10-CM | POA: Insufficient documentation

## 2015-11-09 DIAGNOSIS — K5730 Diverticulosis of large intestine without perforation or abscess without bleeding: Secondary | ICD-10-CM | POA: Diagnosis not present

## 2015-11-09 DIAGNOSIS — Z79899 Other long term (current) drug therapy: Secondary | ICD-10-CM | POA: Diagnosis not present

## 2015-11-09 DIAGNOSIS — R1032 Left lower quadrant pain: Secondary | ICD-10-CM | POA: Diagnosis present

## 2015-11-09 DIAGNOSIS — K219 Gastro-esophageal reflux disease without esophagitis: Secondary | ICD-10-CM | POA: Insufficient documentation

## 2015-11-09 HISTORY — PX: COLONOSCOPY WITH PROPOFOL: SHX5780

## 2015-11-09 HISTORY — PX: BIOPSY: SHX5522

## 2015-11-09 HISTORY — PX: POLYPECTOMY: SHX5525

## 2015-11-09 SURGERY — COLONOSCOPY WITH PROPOFOL
Anesthesia: Monitor Anesthesia Care

## 2015-11-09 MED ORDER — ONDANSETRON HCL 4 MG/2ML IJ SOLN
4.0000 mg | Freq: Once | INTRAMUSCULAR | Status: AC
  Administered 2015-11-09: 4 mg via INTRAVENOUS

## 2015-11-09 MED ORDER — MIDAZOLAM HCL 2 MG/2ML IJ SOLN
INTRAMUSCULAR | Status: AC
  Filled 2015-11-09: qty 2

## 2015-11-09 MED ORDER — MIDAZOLAM HCL 5 MG/5ML IJ SOLN
INTRAMUSCULAR | Status: DC | PRN
  Administered 2015-11-09 (×2): 1 mg via INTRAVENOUS

## 2015-11-09 MED ORDER — LACTATED RINGERS IV SOLN
INTRAVENOUS | Status: DC
  Administered 2015-11-09: 1000 mL via INTRAVENOUS

## 2015-11-09 MED ORDER — ONDANSETRON HCL 4 MG/2ML IJ SOLN: 4.0000 mg | Freq: Once | INTRAMUSCULAR | Status: DC | PRN

## 2015-11-09 MED ORDER — PROPOFOL 500 MG/50ML IV EMUL
INTRAVENOUS | Status: DC | PRN
  Administered 2015-11-09: 125 ug/kg/min via INTRAVENOUS

## 2015-11-09 MED ORDER — IPRATROPIUM-ALBUTEROL 0.5-2.5 (3) MG/3ML IN SOLN
RESPIRATORY_TRACT | Status: AC
  Filled 2015-11-09: qty 3

## 2015-11-09 MED ORDER — FENTANYL CITRATE (PF) 100 MCG/2ML IJ SOLN
INTRAMUSCULAR | Status: AC
  Filled 2015-11-09: qty 2

## 2015-11-09 MED ORDER — FENTANYL CITRATE (PF) 100 MCG/2ML IJ SOLN
INTRAMUSCULAR | Status: DC | PRN
  Administered 2015-11-09: 25 ug via INTRAVENOUS
  Administered 2015-11-09: 50 ug via INTRAVENOUS
  Administered 2015-11-09: 25 ug via INTRAVENOUS

## 2015-11-09 MED ORDER — METHYLCELLULOSE (LAXATIVE) PO POWD: ORAL | Status: DC

## 2015-11-09 MED ORDER — DICYCLOMINE HCL 10 MG PO CAPS: 10.0000 mg | ORAL_CAPSULE | Freq: Three times a day (TID) | ORAL | Status: DC

## 2015-11-09 MED ORDER — PROPOFOL 10 MG/ML IV BOLUS
INTRAVENOUS | Status: AC
Start: 2015-11-09 — End: 2015-11-09
  Filled 2015-11-09: qty 40

## 2015-11-09 MED ORDER — MIDAZOLAM HCL 2 MG/2ML IJ SOLN
1.0000 mg | INTRAMUSCULAR | Status: DC | PRN
  Administered 2015-11-09 (×2): 2 mg via INTRAVENOUS
  Filled 2015-11-09: qty 2

## 2015-11-09 MED ORDER — FENTANYL CITRATE (PF) 100 MCG/2ML IJ SOLN: 25.0000 ug | INTRAMUSCULAR | Status: DC | PRN

## 2015-11-09 MED ORDER — IPRATROPIUM-ALBUTEROL 0.5-2.5 (3) MG/3ML IN SOLN
3.0000 mL | Freq: Once | RESPIRATORY_TRACT | Status: AC
  Administered 2015-11-09: 3 mL via RESPIRATORY_TRACT

## 2015-11-09 MED ORDER — PROPOFOL 10 MG/ML IV BOLUS
INTRAVENOUS | Status: DC | PRN
  Administered 2015-11-09 (×3): 10 mg via INTRAVENOUS

## 2015-11-09 MED ORDER — ONDANSETRON HCL 4 MG/2ML IJ SOLN
INTRAMUSCULAR | Status: AC
  Filled 2015-11-09: qty 2

## 2015-11-09 NOTE — H&P (Signed)
Catherine Costa is an 44 y.o. female.   Chief Complaint: Patient is here for colonoscopy. HPI: Patient is 44-year-old Caucasian female who was recently evaluated for abdominal pain. She was treated for diverticulitis back in January 2017 and follow-up CT last month revealed resolution of these changes. She also has history of Crohn's disease. She does not know whether small or large bowel she's been treated with Humira and prednisone. intermittently for the last 3 years. Last time she was treated for flareup was in 2016. He has intermittent rectal bleeding usually with her bowel movements and small in amount. Family history significant for diverticulitis and her father with surgery. Family history is  negative for IBD and CRC.  Past Medical History  Diagnosis Date  . Pneumonia   . Depression   . Crohn's disease (HCC)   . Hypertension   . Asthma   . Crohn's colitis (HCC)   . Complication of anesthesia     always has severe headache afer any kind of surgery she has had.  . History of pulmonary embolism     07/2015.  . GERD (gastroesophageal reflux disease)   . Sleep apnea     uses CPAP    Past Surgical History  Procedure Laterality Date  . Abdominal hysterectomy    . Fracture surgery Left     ankle  . Tubal ligation    . Septoplasty      Family History  Problem Relation Age of Onset  . Cancer Other    Social History:  reports that she has been smoking Cigarettes.  She has a 12.5 pack-year smoking history. She has never used smokeless tobacco. She reports that she does not drink alcohol or use illicit drugs.  Allergies:  Allergies  Allergen Reactions  . Bee Venom Anaphylaxis  . Sulfa Antibiotics Other (See Comments)    REACTION: Affects Colon  . Voltaren [Diclofenac Sodium] Other (See Comments)    REACTION: Affects Colon    Medications Prior to Admission  Medication Sig Dispense Refill  . acetaminophen (TYLENOL) 500 MG tablet Take 1,000 mg by mouth every 6 (six) hours  as needed for moderate pain.    . albuterol (PROAIR HFA) 108 (90 BASE) MCG/ACT inhaler Inhale 2 puffs into the lungs every 6 (six) hours as needed for wheezing or shortness of breath. 3.7 g 0  . citalopram (CELEXA) 20 MG tablet Take 30 mg by mouth daily.     . clonazePAM (KLONOPIN) 1 MG tablet Take 1 mg by mouth 2 (two) times daily.    . pantoprazole (PROTONIX) 20 MG tablet Take 1 tablet (20 mg total) by mouth daily. 60 tablet 3  . polyethylene glycol-electrolytes (NULYTELY/GOLYTELY) 420 g solution Take 4,000 mLs by mouth once. 4000 mL 0  . ranitidine (ZANTAC) 150 MG capsule Take 150 mg by mouth as needed for heartburn.    . rivaroxaban (XARELTO) 20 MG TABS tablet Take 20 mg by mouth daily with supper.      Results for orders placed or performed during the hospital encounter of 11/07/15 (from the past 48 hour(s))  Basic metabolic panel     Status: Abnormal   Collection Time: 11/07/15  3:00 PM  Result Value Ref Range   Sodium 137 135 - 145 mmol/L   Potassium 4.0 3.5 - 5.1 mmol/L   Chloride 107 101 - 111 mmol/L   CO2 23 22 - 32 mmol/L   Glucose, Bld 107 (H) 65 - 99 mg/dL   BUN 13 6 - 20   mg/dL   Creatinine, Ser 0.87 0.44 - 1.00 mg/dL   Calcium 9.1 8.9 - 10.3 mg/dL   GFR calc non Af Amer >60 >60 mL/min   GFR calc Af Amer >60 >60 mL/min    Comment: (NOTE) The eGFR has been calculated using the CKD EPI equation. This calculation has not been validated in all clinical situations. eGFR's persistently <60 mL/min signify possible Chronic Kidney Disease.    Anion gap 7 5 - 15  CBC with Differential/Platelet     Status: None   Collection Time: 11/07/15  3:00 PM  Result Value Ref Range   WBC 8.0 4.0 - 10.5 K/uL   RBC 4.46 3.87 - 5.11 MIL/uL   Hemoglobin 14.0 12.0 - 15.0 g/dL   HCT 42.1 36.0 - 46.0 %   MCV 94.4 78.0 - 100.0 fL   MCH 31.4 26.0 - 34.0 pg   MCHC 33.3 30.0 - 36.0 g/dL   RDW 13.4 11.5 - 15.5 %   Platelets 217 150 - 400 K/uL   Neutrophils Relative % 57 %   Neutro Abs 4.5 1.7  - 7.7 K/uL   Lymphocytes Relative 32 %   Lymphs Abs 2.6 0.7 - 4.0 K/uL   Monocytes Relative 6 %   Monocytes Absolute 0.5 0.1 - 1.0 K/uL   Eosinophils Relative 5 %   Eosinophils Absolute 0.4 0.0 - 0.7 K/uL   Basophils Relative 0 %   Basophils Absolute 0.0 0.0 - 0.1 K/uL   No results found.  ROS  Blood pressure 104/67, pulse 76, temperature 97.8 F (36.6 C), temperature source Oral, resp. rate 22, height 5' 6" (1.676 m), weight 188 lb (85.276 kg), SpO2 96 %. Physical Exam  Constitutional: She appears well-developed and well-nourished.  HENT:  Mouth/Throat: Oropharynx is clear and moist.  Eyes: Conjunctivae are normal. No scleral icterus.  Neck: No thyromegaly present.  Cardiovascular: Normal rate, regular rhythm and normal heart sounds.   No murmur heard. Respiratory: Effort normal and breath sounds normal.  GI:  Abdomen is full but soft with mild tenderness at LLQ and LUQ. No organomegaly or masses.  Musculoskeletal: She exhibits no edema.  Lymphadenopathy:    She has no cervical adenopathy.  Neurological: She is alert.  Skin: Skin is warm and dry.     Assessment/Plan Left-sided abdominal pain. History of diverticulitis and Crohn's disease. Diagnostic colonoscopy.  Rogene Houston, MD 11/09/2015, 11:19 AM

## 2015-11-09 NOTE — Op Note (Signed)
[facility] Patient Name: Catherine Costa Procedure Date: 11/09/2015 11:29 AM MRN: [id number] Date of Birth: [birth year] Attending MD: Hildred Laser , MD CSN: [id number] Age: 45 Admit Type: Outpatient Procedure:                Colonoscopy Indications:              Abdominal pain in the left lower quadrant.history                            of Crohn's disease and diverticulitis. Providers:                Hildred Laser, MD, Lurline Del, RN, Randa Spike,                            Technician Referring MD:             Manon Hilding Medicines:                Propofol per Anesthesia Complications:            No immediate complications. Estimated Blood Loss:     Estimated blood loss was minimal. Procedure:                Pre-Anesthesia Assessment:                           - Prior to the procedure, a History and Physical                            was performed, and patient medications and                            allergies were reviewed. The patient's tolerance of                            previous anesthesia was also reviewed. The risks                            and benefits of the procedure and the sedation                            options and risks were discussed with the patient.                            All questions were answered, and informed consent                            was obtained. Prior Anticoagulants: The patient                            last took Xarelto (rivaroxaban) 2 days prior to the                            procedure. ASA Grade Assessment: III - A patient  with severe systemic disease. After reviewing the                            risks and benefits, the patient was deemed in                            satisfactory condition to undergo the procedure.                           After obtaining informed consent, the colonoscope                            was passed under direct vision. Throughout the    procedure, the patient's blood pressure, pulse, and                            oxygen saturations were monitored continuously. The                            [id number] was introduced through the                            anus and advanced to the the terminal ileum. The                            colonoscopy was performed without difficulty. The                            patient tolerated the procedure well. The quality                            of the bowel preparation was excellent. The                            terminal ileum, ileocecal valve, appendiceal                            orifice, and rectum were photographed. Scope In: 11:39:12 AM Scope Out: 11:58:45 AM Scope Withdrawal Time: 0 hours 11 minutes 55 seconds  Total Procedure Duration: 0 hours 19 minutes 33 seconds  Findings:      The terminal ileum appeared normal.      A 7 mm polyp was found in the ileocecal valve. The polyp was sessile.       The polyp was removed with a cold snare. Resection and retrieval were       complete.      Multiple small and large-mouthed diverticula were found in the sigmoid       colon, descending colon and transverse colon.      External hemorrhoids were found during retroflexion. The hemorrhoids       were small. Impression:               - The examined portion of terminal ileum was normal.                           -  One 7 mm polyp at the ileocecal valve, removed                            with a cold snare. Resected and retrieved.                           - Moderate diverticulosis in the sigmoid colon, in                            the descending colon and in the transverse colon.                           - External hemorrhoids. Moderate Sedation:      Per Anesthesia Care      Per Anesthesia Care      Per Anesthesia Care Recommendation:           - Patient has a contact number available for                            emergencies. The signs and symptoms of potential                             delayed complications were discussed with the                            patient. Return to normal activities tomorrow.                            Written discharge instructions were provided to the                            patient.                           - High fiber diet today.                           - Continue present medications.                           - Resume Xarelto (rivaroxaban) at prior dose in 2                            days.                           - Await pathology results.                           - Repeat colonoscopy for surveillance based on                            pathology results.                           - Use Citrucel one tablespoon PO daily indefinitely.                           -  Use Bentyl (dicyclomine) 10 mg PO TID 30 min AC                            for 6 months. Procedure Code(s):        --- Professional ---                           [phone], Colonoscopy, flexible; with removal of                            tumor(s), polyp(s), or other lesion(s) by snare                            technique Diagnosis Code(s):        --- Professional ---                           K64.4, Residual hemorrhoidal skin tags                           D12.0, Benign neoplasm of cecum                           R10.32, Left lower quadrant pain                           R10.12, Left upper quadrant pain                           K57.30, Diverticulosis of large intestine without                            perforation or abscess without bleeding CPT copyright 2016 American [employer] Association. All rights reserved. The codes documented in this report are preliminary and upon coder review may  be revised to meet current compliance requirements. Hildred Laser, MD Hildred Laser, MD 11/09/2015 12:10:50 PM This report has been signed electronically. Number of Addenda: 0

## 2015-11-09 NOTE — Anesthesia Preprocedure Evaluation (Signed)
Anesthesia Evaluation  Patient identified by MRN, date of birth, ID band Patient awake    Reviewed: Allergy & Precautions, NPO status , Patient's Chart, lab work & pertinent test results  Airway Mallampati: II  TM Distance: >3 FB Neck ROM: Full    Dental  (+) Teeth Intact, Missing, Dental Advisory Given,    Pulmonary asthma , sleep apnea , pneumonia, resolved, Current Smoker,     + decreased breath sounds      Cardiovascular hypertension, Normal cardiovascular exam     Neuro/Psych Depression    GI/Hepatic GERD  Medicated and Poorly Controlled,  Endo/Other    Renal/GU      Musculoskeletal   Abdominal (+) + obese,   Peds  Hematology   Anesthesia Other Findings   Reproductive/Obstetrics                             Anesthesia Physical Anesthesia Plan  ASA: III  Anesthesia Plan: MAC   Post-op Pain Management:    Induction: Intravenous  Airway Management Planned: Nasal Cannula  Additional Equipment:   Intra-op Plan:   Post-operative Plan:   Informed Consent: I have reviewed the patients History and Physical, chart, labs and discussed the procedure including the risks, benefits and alternatives for the proposed anesthesia with the patient or authorized representative who has indicated his/her understanding and acceptance.   Dental advisory given  Plan Discussed with: CRNA  Anesthesia Plan Comments:         Anesthesia Quick Evaluation

## 2015-11-09 NOTE — Anesthesia Postprocedure Evaluation (Signed)
Anesthesia Post Note  Patient: Catherine Costa  Procedure(s) Performed: Procedure(s) (LRB): COLONOSCOPY WITH PROPOFOL (N/A) POLYPECTOMY BIOPSY  Patient location during evaluation: PACU Anesthesia Type: MAC Level of consciousness: awake and alert and oriented Pain management: pain level controlled Vital Signs Assessment: post-procedure vital signs reviewed and stable Respiratory status: spontaneous breathing Cardiovascular status: blood pressure returned to baseline Postop Assessment: no signs of nausea or vomiting Anesthetic complications: no    Last Vitals:  Filed Vitals:   11/09/15 1230 11/09/15 1242  BP: 115/72 119/82  Pulse: 75 72  Temp:  36.7 C  Resp: 14 16    Last Pain:  Filed Vitals:   11/09/15 1243  PainSc: 6                  Esequiel Kleinfelter

## 2015-11-09 NOTE — Discharge Instructions (Signed)
Resume Xarelto on 11/11/2015. Resume other medications and high fiber diet. Dicyclomine 10 mg by mouth 30 minutes before each meal. Citrucel one heaping tablespoon full daily at bedtime. No driving for 24 hours. Physician will call with biopsy results. High-Fiber Diet Fiber, also called dietary fiber, is a type of carbohydrate found in fruits, vegetables, whole grains, and beans. A high-fiber diet can have many health benefits. Your health care provider may recommend a high-fiber diet to help:  Prevent constipation. Fiber can make your bowel movements more regular.  Lower your cholesterol.  Relieve hemorrhoids, uncomplicated diverticulosis, or irritable bowel syndrome.  Prevent overeating as part of a weight-loss plan.  Prevent heart disease, type 2 diabetes, and certain cancers. WHAT IS MY PLAN? The recommended daily intake of fiber includes:  38 grams for men under age 46.  5 grams for men over age 100.  37 grams for women under age 61.  70 grams for women over age 60. You can get the recommended daily intake of dietary fiber by eating a variety of fruits, vegetables, grains, and beans. Your health care provider may also recommend a fiber supplement if it is not possible to get enough fiber through your diet. WHAT DO I NEED TO KNOW ABOUT A HIGH-FIBER DIET?  Fiber supplements have not been widely studied for their effectiveness, so it is better to get fiber through food sources.  Always check the fiber content on thenutrition facts label of any prepackaged food. Look for foods that contain at least 5 grams of fiber per serving.  Ask your dietitian if you have questions about specific foods that are related to your condition, especially if those foods are not listed in the following section.  Increase your daily fiber consumption gradually. Increasing your intake of dietary fiber too quickly may cause bloating, cramping, or gas.  Drink plenty of water. Water helps you to  digest fiber. WHAT FOODS CAN I EAT? Grains Whole-grain breads. Multigrain cereal. Oats and oatmeal. Brown rice. Barley. Bulgur wheat. Burley. Bran muffins. Popcorn. Rye wafer crackers. Vegetables Sweet potatoes. Spinach. Kale. Artichokes. Cabbage. Broccoli. Green peas. Carrots. Squash. Fruits Berries. Pears. Apples. Oranges. Avocados. Prunes and raisins. Dried figs. Meats and Other Protein Sources Navy, kidney, pinto, and soy beans. Split peas. Lentils. Nuts and seeds. Dairy Fiber-fortified yogurt. Beverages Fiber-fortified soy milk. Fiber-fortified orange juice. Other Fiber bars. The items listed above may not be a complete list of recommended foods or beverages. Contact your dietitian for more options. WHAT FOODS ARE NOT RECOMMENDED? Grains White bread. Pasta made with refined flour. White rice. Vegetables Fried potatoes. Canned vegetables. Well-cooked vegetables.  Fruits Fruit juice. Cooked, strained fruit. Meats and Other Protein Sources Fatty cuts of meat. Fried Sales executive or fried fish. Dairy Milk. Yogurt. Cream cheese. Sour cream. Beverages Soft drinks. Other Cakes and pastries. Butter and oils. The items listed above may not be a complete list of foods and beverages to avoid. Contact your dietitian for more information. WHAT ARE SOME TIPS FOR INCLUDING HIGH-FIBER FOODS IN MY DIET?  Eat a wide variety of high-fiber foods.  Make sure that half of all grains consumed each day are whole grains.  Replace breads and cereals made from refined flour or white flour with whole-grain breads and cereals.  Replace white rice with brown rice, bulgur wheat, or millet.  Start the day with a breakfast that is high in fiber, such as a cereal that contains at least 5 grams of fiber per serving.  Use beans in place of  meat in soups, salads, or pasta.  Eat high-fiber snacks, such as berries, raw vegetables, nuts, or popcorn.   This information is not intended to replace advice given  to you by your health care provider. Make sure you discuss any questions you have with your health care provider.   Document Released: 06/16/2005 Document Revised: 07/07/2014 Document Reviewed: 11/29/2013 Elsevier Interactive Patient Education 2016 Elsevier Inc.  Colonoscopy, Care After Refer to this sheet in the next few weeks. These instructions provide you with information on caring for yourself after your procedure. Your health care provider may also give you more specific instructions. Your treatment has been planned according to current medical practices, but problems sometimes occur. Call your health care provider if you have any problems or questions after your procedure. WHAT TO EXPECT AFTER THE PROCEDURE  After your procedure, it is typical to have the following:  A small amount of blood in your stool.  Moderate amounts of gas and mild abdominal cramping or bloating. HOME CARE INSTRUCTIONS  Do not drive, operate machinery, or sign important documents for 24 hours.  You may shower and resume your regular physical activities, but move at a slower pace for the first 24 hours.  Take frequent rest periods for the first 24 hours.  Walk around or put a warm pack on your abdomen to help reduce abdominal cramping and bloating.  Drink enough fluids to keep your urine clear or pale yellow.  You may resume your normal diet as instructed by your health care provider. Avoid heavy or fried foods that are hard to digest.  Avoid drinking alcohol for 24 hours or as instructed by your health care provider.  Only take over-the-counter or prescription medicines as directed by your health care provider.  If a tissue sample (biopsy) was taken during your procedure:  Do not take aspirin or blood thinners for 7 days, or as instructed by your health care provider.  Do not drink alcohol for 7 days, or as instructed by your health care provider.  Eat soft foods for the first 24 hours. SEEK  MEDICAL CARE IF: You have persistent spotting of blood in your stool 2-3 days after the procedure. SEEK IMMEDIATE MEDICAL CARE IF:  You have more than a small spotting of blood in your stool.  You pass large blood clots in your stool.  Your abdomen is swollen (distended).  You have nausea or vomiting.  You have a fever.  You have increasing abdominal pain that is not relieved with medicine.   This information is not intended to replace advice given to you by your health care provider. Make sure you discuss any questions you have with your health care provider.   Document Released: 01/29/2004 Document Revised: 04/06/2013 Document Reviewed: 02/21/2013 Elsevier Interactive Patient Education [employer].

## 2015-11-09 NOTE — Transfer of Care (Signed)
Immediate Anesthesia Transfer of Care Note  Patient: Catherine Costa  Procedure(s) Performed: Procedure(s) with comments: COLONOSCOPY WITH PROPOFOL (N/A) - 10:40 POLYPECTOMY - polypectomy @ ileocecal valve; BIOPSY - polyp at ileocecal valve  Patient Location: PACU  Anesthesia Type:MAC  Level of Consciousness: awake and alert   Airway & Oxygen Therapy: Patient Spontanous Breathing and Patient connected to nasal cannula oxygen  Post-op Assessment: Report given to RN  Post vital signs: Reviewed and stable  Last Vitals:  Filed Vitals:   11/09/15 1120 11/09/15 1125  BP: 113/70 108/67  Pulse:    Temp:    Resp: 16 14    Last Pain:  Filed Vitals:   11/09/15 1138  PainSc: 6       Patients Stated Pain Goal: 7 ([id number])  Complications: No apparent anesthesia complications

## 2015-11-13 ENCOUNTER — Encounter (HOSPITAL_COMMUNITY): Payer: Self-pay | Admitting: Internal Medicine

## 2017-04-20 ENCOUNTER — Encounter (INDEPENDENT_AMBULATORY_CARE_PROVIDER_SITE_OTHER): Payer: Self-pay | Admitting: Internal Medicine

## 2017-04-22 ENCOUNTER — Encounter (INDEPENDENT_AMBULATORY_CARE_PROVIDER_SITE_OTHER): Payer: Self-pay | Admitting: Internal Medicine

## 2017-04-22 ENCOUNTER — Ambulatory Visit (INDEPENDENT_AMBULATORY_CARE_PROVIDER_SITE_OTHER): Payer: Self-pay | Admitting: Internal Medicine

## 2017-04-23 ENCOUNTER — Encounter (INDEPENDENT_AMBULATORY_CARE_PROVIDER_SITE_OTHER): Payer: Self-pay | Admitting: Internal Medicine

## 2017-05-04 ENCOUNTER — Ambulatory Visit (INDEPENDENT_AMBULATORY_CARE_PROVIDER_SITE_OTHER): Payer: Self-pay | Admitting: Internal Medicine

## 2017-05-05 ENCOUNTER — Ambulatory Visit (INDEPENDENT_AMBULATORY_CARE_PROVIDER_SITE_OTHER): Payer: Self-pay | Admitting: Internal Medicine

## 2017-05-07 ENCOUNTER — Encounter (INDEPENDENT_AMBULATORY_CARE_PROVIDER_SITE_OTHER): Payer: Self-pay | Admitting: Internal Medicine

## 2019-04-26 ENCOUNTER — Encounter: Payer: Self-pay | Admitting: *Deleted

## 2019-04-27 ENCOUNTER — Encounter: Payer: Self-pay | Admitting: Cardiology

## 2019-04-27 ENCOUNTER — Ambulatory Visit: Payer: Self-pay | Admitting: Cardiology

## 2019-04-27 NOTE — Progress Notes (Deleted)
Cardiology Office Note  Date: 04/27/2019   ID: Catherine Costa, DOB Feb 07, 1971, MRN [id number]  PCP:  Manon Hilding, MD  Consulting Cardiologist:  Rozann Lesches, MD Electrophysiologist:  None   No chief complaint on file.   History of Present Illness: Catherine Costa is a 48 y.o. female referred for cardiology consultation by Mr. Georgianne Fick for the evaluation of palpitations and blood pressure fluctuations.    Records indicate ER observation at [facility] in September.  Lab work as outlined below including normal troponin T level, normal creatinine with mild hypokalemia, normal hemoglobin, positive UDS for cannabinoids.  I personally reviewed her ECG from September 22 which showed normal sinus rhythm.  Need family history.  Past Medical History:  Diagnosis Date  . Anxiety   . Asthma   . Crohn's disease ([place])   . Depression   . Essential hypertension   . GERD (gastroesophageal reflux disease)   . History of pneumonia   . History of pulmonary embolism 2017  . Sleep apnea    CPAP    Past Surgical History:  Procedure Laterality Date  . BIOPSY  11/09/2015   Procedure: BIOPSY;  Surgeon: Rogene Houston, MD;  [facility];  Service: Endoscopy;;  polyp at ileocecal valve  . COLONOSCOPY WITH PROPOFOL N/A 11/09/2015   Procedure: COLONOSCOPY WITH PROPOFOL;  Surgeon: Rogene Houston, MD;  Location[facility];  Service: Endoscopy;  Laterality: N/A;  10:40  . FRACTURE SURGERY Left    ankle  . POLYPECTOMY  11/09/2015   Procedure: POLYPECTOMY;  Surgeon: Rogene Houston, MD;  [facility];  Service: Endoscopy;;  polypectomy @ ileocecal valve;  Marland Kitchen SEPTOPLASTY    . TUBAL LIGATION    . VAGINAL HYSTERECTOMY      Current Outpatient Medications  Medication Sig Dispense Refill  . acetaminophen (TYLENOL) 500 MG tablet Take 1,000 mg by mouth every 6 (six) hours as needed for moderate pain.    Marland Kitchen albuterol (PROAIR HFA) 108 (90 BASE) MCG/ACT inhaler  Inhale 2 puffs into the lungs every 6 (six) hours as needed for wheezing or shortness of breath. 3.7 g 0  . citalopram (CELEXA) 20 MG tablet Take 30 mg by mouth daily.     . clonazePAM (KLONOPIN) 1 MG tablet Take 1 mg by mouth 2 (two) times daily.    Marland Kitchen dicyclomine (BENTYL) 10 MG capsule Take 1 capsule (10 mg total) by mouth 3 (three) times daily before meals. 90 capsule 5  . methylcellulose (CITRUCEL) oral powder 1 heaping tablespoon full daily at bedtime    . pantoprazole (PROTONIX) 20 MG tablet Take 1 tablet (20 mg total) by mouth daily. 60 tablet 3  . ranitidine (ZANTAC) 150 MG capsule Take 150 mg by mouth as needed for heartburn.    . rivaroxaban (XARELTO) 20 MG TABS tablet Take 1 tablet (20 mg total) by mouth daily with supper. 30 tablet    No current facility-administered medications for this visit.    Allergies:  Bee venom, Sulfa antibiotics, and Voltaren [diclofenac sodium]   Social History: The patient  reports that she has been smoking cigarettes. She has a 12.50 pack-year smoking history. She has never used smokeless tobacco. She reports that she does not drink alcohol or use drugs.   Family History: The patient's family history is not on file.   ROS:  Please see the history of present illness. Otherwise, complete review of systems is positive for {NONE DEFAULTED:18576::"none"}.  All other systems are reviewed and negative.   Physical Exam: VS:  There were no vitals taken for this visit., BMI There is no height or weight on file to calculate BMI.  Wt Readings from Last 3 Encounters:  11/09/15 188 lb (85.3 kg)  11/07/15 188 lb (85.3 kg)  10/02/15 182 lb 9.6 oz (82.8 kg)    General: Patient appears comfortable at rest. HEENT: Conjunctiva and lids normal, oropharynx clear with moist mucosa. Neck: Supple, no elevated JVP or carotid bruits, no thyromegaly. Lungs: Clear to auscultation, nonlabored breathing at rest. Cardiac: Regular rate and rhythm, no S3 or significant systolic  murmur, no pericardial rub. Abdomen: Soft, nontender, no hepatomegaly, bowel sounds present, no guarding or rebound. Extremities: No pitting edema, distal pulses 2+. Skin: Warm and dry. Musculoskeletal: No kyphosis. Neuropsychiatric: Alert and oriented x3, affect grossly appropriate.  ECG:  An ECG dated 11/07/2015 was personally reviewed today and demonstrated:  Normal sinus rhythm with lead motion artifact.  Recent Labwork:  September 2020: Potassium 3.4, BUN 19, creatinine 0.75, AST 15, ALT 11, troponin T less than 0.01, hemoglobin 13.3, platelets 242, UDS positive for cannabinoids  Other Studies Reviewed Today:  Abdominal CT 10/04/2015: FINDINGS: Lower chest:  Lung bases are clear.  Hepatobiliary: Liver is prominent, measuring 21.6 cm in length. 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 There are no focal liver lesions appreciable. Gallbladder wall is not appreciably thickened. There is no biliary duct dilatation.  Pancreas: No pancreatic mass or inflammatory focus.  Spleen: No splenic lesions are evident.  Adrenals/Urinary Tract: Right adrenal appears normal. There is a left adrenal adenoma measuring 2.7 x 1.6 cm. Kidneys bilaterally show no evidence of mass or hydronephrosis. There is no renal or ureteral calculus on either side. The urinary bladder is nearly totally decompressed. The urinary bladder wall does not appear appreciably thickened.  Stomach/Bowel: There are scattered diverticula in the sigmoid colon. There is no diverticulitis. There is no bowel wall or mesenteric thickening. Terminal ileum in particular shows no wall thickening. No evidence of abscess. There is mild lipomatous infiltration of the ileocecal valve. There is no bowel obstruction. No free air or portal venous air.  Vascular/Lymphatic: There is no abdominal aortic aneurysm. No vascular lesions are evident. There is no adenopathy in the abdomen or pelvis.  Reproductive:  The uterus is absent. There is no pelvic mass or pelvic fluid collection.  Other: Appendix appears normal. There is no abscess or ascites in the abdomen or pelvis. There is a rather minimal ventral hernia containing only fat.  Musculoskeletal: There is degenerative change in the lumbar spine with disc space narrowing and vacuum phenomenon at L5-S1. There is no demonstrable sacroiliitis. There are no blastic or lytic bone lesions. There is no intramuscular or abdominal wall lesion.  IMPRESSION: No bowel wall or mesenteric thickening. Terminal ileum appears normal. No bowel obstruction. There are scattered sigmoid diverticula without evidence of diverticulitis.  Appendix appears normal.  No abscess.  No renal or ureteral calculus.  No hydronephrosis.  Uterus absent. Rather minimal ventral hernia containing only fat. Vacuum phenomenon at L5-S1 with marked disc space narrowing at this level.  Prominent liver without focal lesion.  Assessment and Plan:    Medication Adjustments/Labs and Tests Ordered: Current medicines are reviewed at length with the patient today.  Concerns regarding medicines are outlined above.   Tests Ordered: No orders of the defined types were placed in this encounter.   Medication Changes: No orders of the  defined types were placed in this encounter.   Disposition:  Follow up {follow up:15908}  Signed, Satira Sark, MD, [facility] 04/27/2019 8:14 AM    [place] at [place], [place], [place] [address] Phone: [phone]; Fax: [phone]

## 2019-06-09 ENCOUNTER — Other Ambulatory Visit: Payer: Self-pay

## 2019-06-09 ENCOUNTER — Encounter: Payer: Self-pay | Admitting: *Deleted

## 2019-06-09 ENCOUNTER — Ambulatory Visit (INDEPENDENT_AMBULATORY_CARE_PROVIDER_SITE_OTHER): Payer: Self-pay | Admitting: Cardiology

## 2019-06-09 ENCOUNTER — Encounter: Payer: Self-pay | Admitting: Cardiology

## 2019-06-09 VITALS — BP 120/82 | HR 65 | Ht 65.0 in | Wt 174.0 lb

## 2019-06-09 DIAGNOSIS — G4733 Obstructive sleep apnea (adult) (pediatric): Secondary | ICD-10-CM

## 2019-06-09 DIAGNOSIS — R0789 Other chest pain: Secondary | ICD-10-CM

## 2019-06-09 DIAGNOSIS — R079 Chest pain, unspecified: Secondary | ICD-10-CM

## 2019-06-09 DIAGNOSIS — M79601 Pain in right arm: Secondary | ICD-10-CM

## 2019-06-09 DIAGNOSIS — M79602 Pain in left arm: Secondary | ICD-10-CM

## 2019-06-09 NOTE — Patient Instructions (Addendum)
Medication Instructions:   Your physician recommends that you continue on your current medications as directed. Please refer to the Current Medication list given to you today.  Labwork:  NONE  Testing/Procedures: Your physician has requested that you have an echocardiogram. Echocardiography is a painless test that uses sound waves to create images of your heart. It provides your doctor with information about the size and shape of your heart and how well your heart's chambers and valves are working. This procedure takes approximately one hour. There are no restrictions for this procedure.  Follow-Up:  Your physician recommends that you schedule a follow-up appointment in: pending.  Any Other Special Instructions Will Be Listed Below (If Applicable).  If you need a refill on your cardiac medications before your next appointment, please call your pharmacy. 

## 2019-06-09 NOTE — Progress Notes (Signed)
Cardiology Office Note  Date: 06/09/2019   ID: Catherine Costa, DOB Feb 26, 1971, MRN [id number]  PCP:  Manon Hilding, MD  Consulting Cardiologist:  Rozann Lesches, MD Electrophysiologist:  None   Chief Complaint  Patient presents with  . Chest discomfort    History of Present Illness: Catherine Costa is a 48 y.o. female referred for cardiology consultation by Dr. Quintin Alto for the evaluation of blood pressure fluctuations, chest pain and palpitations.  We discussed her symptoms today.  She tells me that for over the last year she has been experiencing a constant feeling of chest and upper back discomfort, not provoked by exertion or emotional upset, not alleviated by rest, not associated with any palpitations or obvious provoking features.  She also states that she has noticed generally elevated blood pressures in the mornings when she first gets up, this tends to come down significantly by the afternoon.  She does have sleep apnea, has not been using CPAP.  She is currently not on any antihypertensive medications.  She also describes achiness, pain, and paresthesias in her arms, worse with use and work.  She has wondered whether she has a "pinched nerve."  She does report prior trouble with her left shoulder but has had no recent orthopedic or rheumatological evaluation.  She is currently working at [employer], previously worked in Building surveyor.  I reviewed her current medications which are outlined below.  ECG from September showed normal sinus rhythm, troponin T level was negative at ER visit.  Chest x-ray also showed no acute findings.  Past Medical History:  Diagnosis Date  . Anxiety   . Asthma   . Crohn's disease ([place])   . Depression   . Essential hypertension   . GERD (gastroesophageal reflux disease)   . History of pneumonia   . History of pulmonary embolism 2017  . Sleep apnea     Past Surgical History:  Procedure Laterality Date  . BIOPSY  11/09/2015   Procedure: BIOPSY;   Surgeon: Rogene Houston, MD;  [facility];  Service: Endoscopy;;  polyp at ileocecal valve  . COLONOSCOPY WITH PROPOFOL N/A 11/09/2015   Procedure: COLONOSCOPY WITH PROPOFOL;  Surgeon: Rogene Houston, MD;  [facility];  Service: Endoscopy;  Laterality: N/A;  10:40  . FRACTURE SURGERY Left    ankle  . POLYPECTOMY  11/09/2015   Procedure: POLYPECTOMY;  Surgeon: Rogene Houston, MD;  [facility];  Service: Endoscopy;;  polypectomy @ ileocecal valve;  Marland Kitchen SEPTOPLASTY    . TUBAL LIGATION    . VAGINAL HYSTERECTOMY      Current Outpatient Medications  Medication Sig Dispense Refill  . acetaminophen (TYLENOL) 500 MG tablet Take 1,000 mg by mouth every 6 (six) hours as needed for moderate pain.    Marland Kitchen albuterol (PROAIR HFA) 108 (90 BASE) MCG/ACT inhaler Inhale 2 puffs into the lungs every 6 (six) hours as needed for wheezing or shortness of breath. 3.7 g 0  . citalopram (CELEXA) 40 MG tablet Take 40 mg by mouth daily.    . clonazePAM (KLONOPIN) 1 MG tablet Take 1 mg by mouth 3 (three) times daily as needed.     . methylcellulose (CITRUCEL) oral powder 1 heaping tablespoon full daily at bedtime     No current facility-administered medications for this visit.   Allergies:  Bee venom, Sulfa antibiotics, and Voltaren [diclofenac sodium]   Social History: The patient  reports that she has quit smoking. Her smoking  use included cigarettes. She has a 12.50 pack-year smoking history. She has never used smokeless tobacco. She reports that she does not drink alcohol or use drugs.   Family History: The patient's family history includes Arrhythmia in her father; Heart attack in her maternal grandfather and paternal grandmother; Heart disease in her father and paternal grandmother; Hypertension in her maternal grandfather, maternal grandmother, paternal grandfather, and paternal grandmother.   ROS:  Please see the history of present illness. Otherwise, complete review of systems  is positive for fatigue.  All other systems are reviewed and negative.   Physical Exam: VS:  BP 120/82   Pulse 65   Ht 5\' 5"  (1.651 m)   Wt 174 lb (78.9 kg)   SpO2 97%   BMI 28.96 kg/m , BMI Body mass index is 28.96 kg/m.  Wt Readings from Last 3 Encounters:  06/09/19 174 lb (78.9 kg)  11/09/15 188 lb (85.3 kg)  11/07/15 188 lb (85.3 kg)    General: Patient appears comfortable at rest. HEENT: Conjunctiva and lids normal, wearing a mask. Neck: Supple, no elevated JVP or carotid bruits, no thyromegaly. Lungs: Clear to auscultation, nonlabored breathing at rest. Cardiac: Regular rate and rhythm, no S3 or significant systolic murmur, no pericardial rub. Abdomen: Soft, nontender, bowel sounds present. Extremities: No pitting edema, distal pulses 2+. Skin: Warm and dry. Musculoskeletal: No kyphosis. Neuropsychiatric: Alert and oriented x3, affect grossly appropriate.  ECG:  An ECG dated 03/22/2019 was personally reviewed today and demonstrated:  Sinus rhythm.  Recent Labwork:  September 2020: Potassium 3.4, BUN 19, creatinine 0.75, magnesium 1.8, AST 15, ALT 11, troponin T less than 0.01, hemoglobin 13.3, platelets 242, UDS positive for cannabinoids  Other Studies Reviewed Today:  Chest x-ray 03/22/2019 [facility]): No active cardiopulmonary disease.  Assessment and Plan:  1.  Noncardiac chest pain based on description.  Question whether this could be related to an inflammatory process.  ECG and troponin T level were normal in September and cardiac exam is unremarkable today.  We will obtain an echocardiogram to ensure normal cardiac structure and function, rule out pericardial effusion.  If reassuring would continue evaluation with PCP.  2.  Reported fluctuations in blood pressure.  High measurements in the mornings in the setting of untreated OSA would make sense.  I talked with her about compliance with CPAP, if she is having concerns with her device she could always be  referred to a sleep medicine specialist.  3.  Bilateral arm pain, joint pain, and paresthesias.  This does not sound cardiac, she had normal radial pulses and capillary refill in her hands.  Consider evaluation by rheumatologist.  Medication Adjustments/Labs and Tests Ordered: Current medicines are reviewed at length with the patient today.  Concerns regarding medicines are outlined above.   Tests Ordered: Orders Placed This Encounter  Procedures  . ECHOCARDIOGRAM COMPLETE    Medication Changes: No orders of the defined types were placed in this encounter.   Disposition:  Follow up test results.  Signed, Satira Sark, MD, [facility] 06/09/2019 11:13 AM    [place] at [place], [place], [place] [address] Phone: [phone]; Fax: [phone]

## 2019-06-28 ENCOUNTER — Other Ambulatory Visit: Payer: Self-pay

## 2019-06-28 ENCOUNTER — Ambulatory Visit (INDEPENDENT_AMBULATORY_CARE_PROVIDER_SITE_OTHER): Payer: Self-pay

## 2019-06-28 DIAGNOSIS — R079 Chest pain, unspecified: Secondary | ICD-10-CM

## 2019-06-29 ENCOUNTER — Telehealth: Payer: Self-pay | Admitting: *Deleted

## 2019-06-29 NOTE — Telephone Encounter (Signed)
-----   Message from Satira Sark, MD sent at 06/28/2019  1:55 PM EST ----- Results reviewed.  Please let her know that the echocardiogram was reassuring.  LVEF normal at 60 to 65%, no significant valvular abnormalities, no pericardial effusion.  Keep follow-up with PCP.

## 2019-06-29 NOTE — Telephone Encounter (Signed)
LM to return call.

## 2019-06-29 NOTE — Telephone Encounter (Signed)
Pt aware - routed to pcp  

## 2019-12-05 ENCOUNTER — Encounter (INDEPENDENT_AMBULATORY_CARE_PROVIDER_SITE_OTHER): Payer: Self-pay | Admitting: Gastroenterology

## 2019-12-05 ENCOUNTER — Ambulatory Visit (INDEPENDENT_AMBULATORY_CARE_PROVIDER_SITE_OTHER): Payer: Self-pay | Admitting: Gastroenterology

## 2019-12-05 ENCOUNTER — Other Ambulatory Visit: Payer: Self-pay

## 2019-12-05 VITALS — BP 112/80 | HR 73 | Temp 97.4°F | Ht 66.0 in | Wt 167.9 lb

## 2019-12-05 DIAGNOSIS — K219 Gastro-esophageal reflux disease without esophagitis: Secondary | ICD-10-CM

## 2019-12-05 DIAGNOSIS — R197 Diarrhea, unspecified: Secondary | ICD-10-CM

## 2019-12-05 DIAGNOSIS — K50119 Crohn's disease of large intestine with unspecified complications: Secondary | ICD-10-CM

## 2019-12-05 DIAGNOSIS — R112 Nausea with vomiting, unspecified: Secondary | ICD-10-CM

## 2019-12-05 DIAGNOSIS — K5732 Diverticulitis of large intestine without perforation or abscess without bleeding: Secondary | ICD-10-CM

## 2019-12-05 MED ORDER — PANTOPRAZOLE SODIUM 40 MG PO TBEC: 40.0000 mg | DELAYED_RELEASE_TABLET | Freq: Every day | ORAL | 3 refills | Status: AC

## 2019-12-05 MED ORDER — ONDANSETRON HCL 4 MG PO TABS: 4.0000 mg | ORAL_TABLET | Freq: Three times a day (TID) | ORAL | 1 refills | Status: AC | PRN

## 2019-12-05 MED ORDER — DICYCLOMINE HCL 10 MG PO CAPS: 10.0000 mg | ORAL_CAPSULE | Freq: Three times a day (TID) | ORAL | 0 refills | Status: DC | PRN

## 2019-12-05 NOTE — Progress Notes (Signed)
Patient profile: Catherine Costa is a 49 y.o. female seen for evaluation of Crohn's . Last seen in clinic 2017.    History of Present Illness: Catherine Costa is seen today for evaluation of multiple GI symptoms.  She was last seen in our clinic in 2017 for Crohn's disease.  She reports not having care intermittently from 2017-2021 due to lack of insurance.  She is trying to get Medicaid and disability currently.  Limited records available for review-she reports over the past several years multiple admissions and ER visits to [facility] for symptoms including nausea, vomiting, abdominal pain, diarrhea, bloody stool.  She reports she has been diagnosed with both Crohn's flares and diverticulitis.  Reports she was recommended to have diverticular resection but unable to because of self pay.  Denies any recent therapy for Crohn's disease, unsure what treatments she has had for diverticulitis and crohn's. Reports she has lost several jobs due to severity of GI issues.   Currently symptoms include diarrhea with up to 5 BM a day, reports some abdominal pain in her left lower quadrant as well as epigastric pain that is been throbbing and dull with occasional burning in EG area.  She feels she has some intermittent nausea vomiting and has been using Zofran from prior ER visits.  Her GERD symptoms seem fairly minimal since she has lost some weight.  Reports she has varied in weight from 130s to 190s over the past few years, currently weight is 168.  She describes some occasional liquids dysphagia that seems consistent w/ dysmotility, no significant solids dysphagia. She is using Aleve 3 times a week.  She denies any tobacco or alcohol.  Wt Readings from Last 3 Encounters:  12/05/19 167 lb 14.4 oz (76.2 kg)  06/09/19 174 lb (78.9 kg)  11/09/15 188 lb (85.3 kg)     Last Colonoscopy: Colonoscopy 2017-TI normal, 7 mm polyp IC valve, diverticulosis sigmoid descending and transverse colon. Polyp TA 5 year  repeat recommended    Past Medical History:  Past Medical History:  Diagnosis Date  . Anxiety   . Asthma   . Crohn's disease ([place])   . Depression   . Essential hypertension   . GERD (gastroesophageal reflux disease)   . History of pneumonia   . History of pulmonary embolism 2017  . Sleep apnea     Problem List: Patient Active Problem List   Diagnosis Date Noted  . Essential hypertension 10/02/2015  . Diverticulitis of colon 10/02/2015  . Pancreatitis, acute 10/02/2015  . Suicidal thoughts 02/07/2012  . Homeless 02/07/2012  . Depression 02/07/2012    Past Surgical History: Past Surgical History:  Procedure Laterality Date  . BIOPSY  11/09/2015   Procedure: BIOPSY;  Surgeon: Rogene Houston, MD;  [facility];  Service: Endoscopy;;  polyp at ileocecal valve  . COLONOSCOPY WITH PROPOFOL N/A 11/09/2015   Procedure: COLONOSCOPY WITH PROPOFOL;  Surgeon: Rogene Houston, MD;  [facility];  Service: Endoscopy;  Laterality: N/A;  10:40  . FRACTURE SURGERY Left    ankle  . POLYPECTOMY  11/09/2015   Procedure: POLYPECTOMY;  Surgeon: Rogene Houston, MD;  [facility];  Service: Endoscopy;;  polypectomy @ ileocecal valve;  Marland Kitchen SEPTOPLASTY    . TUBAL LIGATION    . VAGINAL HYSTERECTOMY      Allergies: Allergies  Allergen Reactions  . Bee Venom Anaphylaxis  . Sulfa Antibiotics Other (See Comments)    REACTION: Affects Colon  .  Voltaren [Diclofenac Sodium] Other (See Comments)    REACTION: Affects Colon      Home Medications:  Current Outpatient Medications:  .  albuterol (PROAIR HFA) 108 (90 BASE) MCG/ACT inhaler, Inhale 2 puffs into the lungs every 6 (six) hours as needed for wheezing or shortness of breath., Disp: 3.7 g, Rfl: 0 .  citalopram (CELEXA) 40 MG tablet, Take 40 mg by mouth daily., Disp: , Rfl:  .  clonazePAM (KLONOPIN) 1 MG tablet, Take 1 mg by mouth 3 (three) times daily as needed. , Disp: , Rfl:  .  methylcellulose (CITRUCEL) oral  powder, 1 heaping tablespoon full daily at bedtime, Disp: , Rfl:  .  naproxen sodium (ALEVE) 220 MG tablet, Take 220 mg by mouth as needed. For stomach pain, Disp: , Rfl:  .  acetaminophen (TYLENOL) 500 MG tablet, Take 1,000 mg by mouth every 6 (six) hours as needed for moderate pain., Disp: , Rfl:  .  dicyclomine (BENTYL) 10 MG capsule, Take 1 capsule (10 mg total) by mouth 3 (three) times daily as needed for spasms (abd pain)., Disp: 90 capsule, Rfl: 0 .  ondansetron (ZOFRAN) 4 MG tablet, Take 1 tablet (4 mg total) by mouth every 8 (eight) hours as needed for nausea or vomiting., Disp: 30 tablet, Rfl: 1 .  pantoprazole (PROTONIX) 40 MG tablet, Take 1 tablet (40 mg total) by mouth daily., Disp: 30 tablet, Rfl: 3   Family History: family history includes Arrhythmia in her father; Heart attack in her maternal grandfather and paternal grandmother; Heart disease in her father and paternal grandmother; Hypertension in her maternal grandfather, maternal grandmother, paternal grandfather, and paternal grandmother.    Social History:   reports that she has quit smoking. Her smoking use included cigarettes. She has a 12.50 pack-year smoking history. She has never used smokeless tobacco. She reports that she does not drink alcohol or use drugs.   Review of Systems: Constitutional: Denies weight loss/weight gain  Eyes: No changes in vision. ENT: No oral lesions, sore throat.  GI: see HPI.  Heme/Lymph: No easy bruising.  CV: No chest pain.  GU: No hematuria.  Integumentary: No rashes.  Neuro: No headaches.  Psych: No depression/anxiety.  Endocrine: No heat/cold intolerance.  Allergic/Immunologic: No urticaria.  Resp: No cough, SOB.  Musculoskeletal: No joint swelling.    Physical Examination: BP 112/80 (BP Location: Right Arm, Patient Position: Sitting, Cuff Size: Large)   Pulse 73   Temp (!) 97.4 F (36.3 C) (Temporal)   Ht 5\' 6"  (1.676 m)   Wt 167 lb 14.4 oz (76.2 kg)   BMI 27.10 kg/m    Gen: NAD, alert and oriented x 4, tearful HEENT: PEERLA, EOMI, Neck: supple, no JVD Chest: CTA bilaterally, no wheezes, crackles, or other adventitious sounds CV: RRR, no m/g/c/r Abd: soft, mild diffuse TTP, ND, +BS in all four quadrants; no HSM, guarding, ridigity, or rebound tenderness Ext: no edema, well perfused with 2+ pulses, Skin: no rash or lesions noted on observed skin Lymph: no noted LAD  Data Reviewed:   Extensive records requested from [facility] for multiple hospitalizations, CT scans, labs.  Assessment/Plan: Catherine Costa is a 49 y.o. female   1.  History of Crohn's disease-remote history prior to 2016 with treatment including Humira and prednisone, has not been any maintenance therapy for many years given lack of insurance.   She describes admissions for both "Crohn's flares and diverticulitis" at [facility], I have requested these records.  She also had labs recently there  I requested.  She denies having any stool studies through [facility] and given her current diarrhea with 5 episodes a day will check to exclude infection.     Symptomatically she reports improvement in the past w/ her symptoms with dicyclomine and will refill this until able to collect records with further recommendations.  She likely will need a repeat colonoscopy in the near future  She also has some GERD, nausea, and epigastric pain-some remote NSAID use, will start her on a PPI in interim.  Josiah was seen today for new patient (initial visit).  Diagnoses and all orders for this visit:  Chronic GERD -     C. difficile GDH and Toxin A/B  Nausea and vomiting, intractability of vomiting not specified, unspecified vomiting type  Crohn's disease of colon with complication (HCC)  Diverticulitis of colon -     C. difficile GDH and Toxin A/B -     Gastrointestinal Pathogen Panel PCR  Diarrhea, unspecified type -     C. difficile GDH and Toxin A/B  Other orders -     dicyclomine (BENTYL) 10 MG  capsule; Take 1 capsule (10 mg total) by mouth 3 (three) times daily as needed for spasms (abd pain). -     ondansetron (ZOFRAN) 4 MG tablet; Take 1 tablet (4 mg total) by mouth every 8 (eight) hours as needed for nausea or vomiting. -     pantoprazole (PROTONIX) 40 MG tablet; Take 1 tablet (40 mg total) by mouth daily.    Case to be discussed w/ Dr Laural Golden when records return.   I personally performed the service, non-incident to. (WP)  Laurine Blazer, [facility]

## 2019-12-12 LAB — C. DIFFICILE GDH AND TOXIN A/B
GDH ANTIGEN: NOT DETECTED
MICRO NUMBER:: 10567940
SPECIMEN QUALITY:: ADEQUATE
TOXIN A AND B: NOT DETECTED

## 2019-12-12 LAB — GASTROINTESTINAL PATHOGEN PANEL PCR
C. difficile Tox A/B, PCR: NOT DETECTED
Campylobacter, PCR: NOT DETECTED
Cryptosporidium, PCR: NOT DETECTED
E coli (ETEC) LT/ST PCR: NOT DETECTED
E coli (STEC) stx1/stx2, PCR: NOT DETECTED
E coli 0157, PCR: NOT DETECTED
Giardia lamblia, PCR: NOT DETECTED
Norovirus, PCR: NOT DETECTED
Rotavirus A, PCR: NOT DETECTED
Salmonella, PCR: NOT DETECTED
Shigella, PCR: NOT DETECTED

## 2019-12-13 ENCOUNTER — Telehealth (INDEPENDENT_AMBULATORY_CARE_PROVIDER_SITE_OTHER): Payer: Self-pay | Admitting: Gastroenterology

## 2019-12-13 NOTE — Telephone Encounter (Signed)
Faxed request again for CT

## 2019-12-29 NOTE — Telephone Encounter (Signed)
Received - attempted to contact patient x 2 to discuss. All questions answered.

## 2020-10-23 ENCOUNTER — Encounter (INDEPENDENT_AMBULATORY_CARE_PROVIDER_SITE_OTHER): Payer: Self-pay | Admitting: *Deleted

## 2021-04-11 ENCOUNTER — Encounter (INDEPENDENT_AMBULATORY_CARE_PROVIDER_SITE_OTHER): Payer: Self-pay

## 2021-04-11 ENCOUNTER — Ambulatory Visit (INDEPENDENT_AMBULATORY_CARE_PROVIDER_SITE_OTHER): Payer: Self-pay | Admitting: Gastroenterology

## 2021-04-11 ENCOUNTER — Encounter (INDEPENDENT_AMBULATORY_CARE_PROVIDER_SITE_OTHER): Payer: Self-pay | Admitting: Gastroenterology

## 2021-04-11 ENCOUNTER — Other Ambulatory Visit: Payer: Self-pay

## 2021-04-11 ENCOUNTER — Other Ambulatory Visit (INDEPENDENT_AMBULATORY_CARE_PROVIDER_SITE_OTHER): Payer: Self-pay

## 2021-04-11 ENCOUNTER — Telehealth (INDEPENDENT_AMBULATORY_CARE_PROVIDER_SITE_OTHER): Payer: Self-pay

## 2021-04-11 DIAGNOSIS — R1310 Dysphagia, unspecified: Secondary | ICD-10-CM

## 2021-04-11 DIAGNOSIS — K582 Mixed irritable bowel syndrome: Secondary | ICD-10-CM

## 2021-04-11 DIAGNOSIS — R1084 Generalized abdominal pain: Secondary | ICD-10-CM

## 2021-04-11 DIAGNOSIS — K589 Irritable bowel syndrome without diarrhea: Secondary | ICD-10-CM | POA: Insufficient documentation

## 2021-04-11 DIAGNOSIS — G8929 Other chronic pain: Secondary | ICD-10-CM

## 2021-04-11 DIAGNOSIS — K6389 Other specified diseases of intestine: Secondary | ICD-10-CM

## 2021-04-11 MED ORDER — HYOSCYAMINE SULFATE 0.125 MG SL SUBL: 0.1250 mg | SUBLINGUAL_TABLET | SUBLINGUAL | 0 refills | Status: AC | PRN

## 2021-04-11 NOTE — Progress Notes (Signed)
Catherine Costa, M.D. Gastroenterology & Hepatology [facility] [address] [place], [place] [address]  Primary Care Physician: Manon Hilding, MD [facility] [place] [address]  I will communicate my assessment and recommendations to the referring MD via EMR.  Problems: IBS ?Crohn's disease ?Possible previous pancreatitis  History of Present Illness: Catherine Costa is a 50 y.o. female with past medical history of ?  Crohn's disease, GERD, depression, anxiety, GERD, h/o PE, unclear history of previous pancreatitis in the past, who presents for follow up after recent visit to the ER due to abdominal pain.  The patient was last seen on 12/05/2019. At that time, the patient records were requested but they were never received.  She underwent testing for C. difficile and GI pathogen panel given history of diarrhea and was given a prescription for Bentyl, Zofran and Protonix.  She did not perform any of the testing.  The patient comes for follow-up after recent ER visit at [facility] on 04/08/2021.  She went to the hospital after presenting exposure to COVID and headache with congestion, fever and chest pain.  She also presented abdominal pain in her left lower quadrant while in the ER. She reports the pain was similar to the episodes she has "had for multiple years". States her pain is usually generalized but worse in the LLQ. She reports that the pain is worse when she wants to have a bowel movement or when she eats food.  She states that she did talk about the pain as she was asked if she had any other complaints but reported that her symptoms were not new.  Underwent a work-up that includes a normal x-ray of the chest, EKG with normal sinus rhythm, urinalysis was within normal limits, CBC shows a white blood cell count of 7.8, hemoglobin 14.2, platelets 284, total bilirubin 0.3, ALT 24, AST 16, alkaline phosphatase 92, lipase was 861 normal  electrolytes and renal function.  A CT scan of the abdomen and pelvis with IV contrast was performed at that time which showed diverticulosis of the colon without evidence of diverticulitis, pancreas was unremarkable without any inflammatory changes, there was presence of annular mural thickening with an apple core appearance at the junction of the descending and ascending colon concerning for malignancy.  The patient was given a diagnosis of acute pancreatitis and was advised to follow with GI for further management.  Based on clinical notes, it was unclear if she had or not history of Crohn's as notes states she was being on Humira and prednisone but she was off treatment for multiple years. She states that she has presented . Patient reports that she was diagnosed on the 1990s at [facility], but does states she has not received any treatment for this. She denies eve being on Humira or other medications She reports that for multiple years (since she was a child) she has presented episodes of abdominal pain an diarrhea alternating with constipation. She had an episode of rectal bleeding in her stool in the 1990s and that is the reason why she had the colonoscopy.  Her most recent colonoscopy was performed in 2017 and did not show any findings consistent with inflammatory bowel disease.  In fact, the patient has had CT of the abdomen in 2010, 2014 and 2017 in our files, none of which showed presence of enteritis or colitis.  She also reports that at least for the last 2 years she has had  issues with dysphagia with solids and liquids. She has these episodes at least once a day. No food impaction. She has had to induce vomiting some times to help relieve the symptoms.  Patient works in a nursing home.  States she follows a diet that basically consists of significant amount of vegetables but tries to avoid other types of "heavy food".  The patient denies having any nausea, vomiting, fever, chills,  hematochezia, melena, hematemesis, diarrhea, jaundice, pruritus or weight loss. She has noticed increased appetite during the night. She has presented worsening recurrent episodes of melena and hematochezia in the last month.  Last EGD: never Last Colonoscopy: 2017 - TI normal, 7 mm polyp IC valve, diverticulosis sigmoid descending and transverse colon. Polyp TA 5 year repeat recommended   Past Medical History: Past Medical History:  Diagnosis Date   Anxiety    Asthma    Crohn's disease ([place])    Depression    Essential hypertension    GERD (gastroesophageal reflux disease)    History of pneumonia    History of pulmonary embolism 2017   Sleep apnea     Past Surgical History: Past Surgical History:  Procedure Laterality Date   BIOPSY  11/09/2015   Procedure: BIOPSY;  Surgeon: Rogene Houston, MD;  [facility];  Service: Endoscopy;;  polyp at ileocecal valve   COLONOSCOPY WITH PROPOFOL N/A 11/09/2015   Procedure: COLONOSCOPY WITH PROPOFOL;  Surgeon: Rogene Houston, MD;  [facility];  Service: Endoscopy;  Laterality: N/A;  10:40   FRACTURE SURGERY Left    ankle   POLYPECTOMY  11/09/2015   Procedure: POLYPECTOMY;  Surgeon: Rogene Houston, MD;  [facility];  Service: Endoscopy;;  polypectomy @ ileocecal valve;   SEPTOPLASTY     TUBAL LIGATION     VAGINAL HYSTERECTOMY      Family History: Family History  Problem Relation Age of Onset   Heart disease Father    Arrhythmia Father    Hypertension Maternal Grandmother    Hypertension Maternal Grandfather    Heart attack Maternal Grandfather    Heart attack Paternal Grandmother    Heart disease Paternal Grandmother    Hypertension Paternal Grandmother    Hypertension Paternal Grandfather     Social History: Social History   Tobacco Use  Smoking Status Former   Packs/day: 0.50   Years: 25.00   Pack years: 12.50   Types: Cigarettes  Smokeless Tobacco Never   Social History   Substance  and Sexual Activity  Alcohol Use No   Social History   Substance and Sexual Activity  Drug Use No    Allergies: Allergies  Allergen Reactions   Bee Venom Anaphylaxis   Asa [Aspirin] Other (See Comments)    Abd pain, bloody stools, itching, rash   Sulfa Antibiotics Other (See Comments)    REACTION: Affects Colon   Voltaren [Diclofenac Sodium] Other (See Comments)    REACTION: Affects Colon    Medications: Current Outpatient Medications  Medication Sig Dispense Refill   acetaminophen (TYLENOL) 500 MG tablet Take 1,000 mg by mouth every 6 (six) hours as needed for moderate pain.     albuterol (PROAIR HFA) 108 (90 BASE) MCG/ACT inhaler Inhale 2 puffs into the lungs every 6 (six) hours as needed for wheezing or shortness of breath. 3.7 g 0   citalopram (CELEXA) 40 MG tablet Take 40 mg by mouth daily.     clonazePAM (KLONOPIN) 1 MG tablet Take 1 mg by mouth  3 (three) times daily as needed.      HYDROcodone-acetaminophen (NORCO/VICODIN) 5-325 MG tablet Take 1 tablet by mouth every 6 (six) hours as needed.     hyoscyamine (LEVSIN SL) 0.125 MG SL tablet Place 1 tablet (0.125 mg total) under the tongue every 4 (four) hours as needed. 30 tablet 0   naproxen sodium (ALEVE) 220 MG tablet Take 220 mg by mouth as needed. For stomach pain     ondansetron (ZOFRAN) 4 MG tablet Take 1 tablet (4 mg total) by mouth every 8 (eight) hours as needed for nausea or vomiting. 30 tablet 1   pantoprazole (PROTONIX) 40 MG tablet Take 1 tablet (40 mg total) by mouth daily. (Patient not taking: Reported on 04/11/2021) 30 tablet 3   No current facility-administered medications for this visit.    Review of Systems: GENERAL: negative for malaise, night sweats HEENT: No changes in hearing or vision, no nose bleeds or other nasal problems. NECK: Negative for lumps, goiter, pain and significant neck swelling RESPIRATORY: Negative for cough, wheezing CARDIOVASCULAR: Negative for chest pain, leg swelling,  palpitations, orthopnea GI: SEE HPI MUSCULOSKELETAL: Negative for joint pain or swelling, back pain, and muscle pain. SKIN: Negative for lesions, rash PSYCH: Negative for sleep disturbance, mood disorder and recent psychosocial stressors. HEMATOLOGY Negative for prolonged bleeding, bruising easily, and swollen nodes. ENDOCRINE: Negative for cold or heat intolerance, polyuria, polydipsia and goiter. NEURO: negative for tremor, gait imbalance, syncope and seizures. The remainder of the review of systems is noncontributory.   Physical Exam: BP 111/73 (BP Location: Left Arm, Patient Position: Sitting, Cuff Size: Small)   Pulse 78   Temp 98.1 F (36.7 C) (Oral)   Ht 5\' 5"  (1.651 m)   Wt 168 lb 3.2 oz (76.3 kg)   BMI 27.99 kg/m  GENERAL: The patient is AO x3, in no acute distress. Obese. HEENT: Head is normocephalic and atraumatic. EOMI are intact. Mouth is well hydrated and without lesions. NECK: Supple. No masses LUNGS: Clear to auscultation. No presence of rhonchi/wheezing/rales. Adequate chest expansion HEART: RRR, normal s1 and s2. ABDOMEN: diffuse tenderness upon palpation but worse in the LLQ, no guarding, no peritoneal signs, and nondistended. BS +. No masses. EXTREMITIES: Without any cyanosis, clubbing, rash, lesions or edema. NEUROLOGIC: AOx3, no focal motor deficit. SKIN: no jaundice, no rashes  Imaging/Labs: as above  I personally reviewed and interpreted the available labs, imaging and endoscopic files.  Impression and Plan: Catherine Costa is a 50 y.o. female with past medical history of ?  Crohn's disease, GERD, depression, anxiety, GERD, h/o PE, unclear history of previous pancreatitis in the past, who presents for follow up after recent visit to the ER due to abdominal pain.  The patient was given a diagnosis of acute pancreatitis based on an elevated lipase level but clinically she did not meet the criteria for this as she did not have classic exacerbation of her  symptoms with acute pain in the epigastric area, while the CT scan was negative for any inflammatory changes in her pancreas.  Hence, I explained to the patient that I do not consider she had acute pancreatitis at that time.  She has presented intermittent chronic episodes of pain which have been attributed to Crohn's disease but so far the endoscopic investigations have not show any active inflammation in her colon and the imaging has not been consistent with active inflammation of her bowel.  I suspect her symptoms are mainly related to IBS rather than IBD, for which  she will benefit from implementing a low FODMAP diet as well as from taking Bentyl as needed to improve her symptoms.  We will also check a celiac disease panel today.  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  We will obtain a CEA level today.  As she has presented intermittent episodes of dysphagia, will also proceed with an EGD with possible esophageal dilation.  Patient understood and agreed.  -Schedule EGD and colonoscopy -  Check celiac panel and CEA - Start Levsin 1 tablet q6h as needed for abdominal pain and bloating - Explained presumed etiology of IBS symptoms. Patient was counseled about the benefit of implementing a low FODMAP to improve symptoms and recurrent episodes. A dietary list was provided to the patient. Also, the patient was counseled about the benefit of avoiding stressing situations and potential environmental triggers leading to symptomatology.  All questions were answered.      Harvel Quale, MD Gastroenterology and Hepatology [facility]

## 2021-04-11 NOTE — Patient Instructions (Signed)
Schedule EGD and colonoscopy Perform blood workup Start Levsin 1 tablet q6h as needed for abdominal pain and bloating Explained presumed etiology of IBS symptoms. Patient was counseled about the benefit of implementing a low FODMAP to improve symptoms and recurrent episodes. A dietary list was provided to the patient. Also, the patient was counseled about the benefit of avoiding stressing situations and potential environmental triggers leading to symptomatology.

## 2021-04-12 NOTE — Telephone Encounter (Signed)
LeighAnn Hien Perreira, CMA  

## 2021-04-23 ENCOUNTER — Encounter (INDEPENDENT_AMBULATORY_CARE_PROVIDER_SITE_OTHER): Payer: Self-pay | Admitting: *Deleted

## 2021-04-25 ENCOUNTER — Emergency Department (HOSPITAL_COMMUNITY): Payer: Self-pay

## 2021-04-25 ENCOUNTER — Emergency Department (HOSPITAL_COMMUNITY)
Admission: EM | Admit: 2021-04-25 | Discharge: 2021-04-25 | Disposition: A | Discharge status: Home / Self Care | Payer: Self-pay | Attending: Emergency Medicine | Admitting: Emergency Medicine

## 2021-04-25 ENCOUNTER — Encounter (HOSPITAL_COMMUNITY): Payer: Self-pay | Admitting: *Deleted

## 2021-04-25 DIAGNOSIS — S80812A Abrasion, left lower leg, initial encounter: Secondary | ICD-10-CM | POA: Insufficient documentation

## 2021-04-25 DIAGNOSIS — S52502A Unspecified fracture of the lower end of left radius, initial encounter for closed fracture: Secondary | ICD-10-CM

## 2021-04-25 DIAGNOSIS — I10 Essential (primary) hypertension: Secondary | ICD-10-CM | POA: Insufficient documentation

## 2021-04-25 DIAGNOSIS — W010XXA Fall on same level from slipping, tripping and stumbling without subsequent striking against object, initial encounter: Secondary | ICD-10-CM

## 2021-04-25 DIAGNOSIS — Z87891 Personal history of nicotine dependence: Secondary | ICD-10-CM | POA: Insufficient documentation

## 2021-04-25 DIAGNOSIS — Y990 Civilian activity done for income or pay: Secondary | ICD-10-CM | POA: Insufficient documentation

## 2021-04-25 DIAGNOSIS — S93402A Sprain of unspecified ligament of left ankle, initial encounter: Secondary | ICD-10-CM

## 2021-04-25 DIAGNOSIS — J45909 Unspecified asthma, uncomplicated: Secondary | ICD-10-CM | POA: Insufficient documentation

## 2021-04-25 MED ORDER — ONDANSETRON HCL 4 MG/2ML IJ SOLN
4.0000 mg | Freq: Once | INTRAMUSCULAR | Status: AC
  Administered 2021-04-25: 4 mg via INTRAVENOUS
  Filled 2021-04-25: qty 2

## 2021-04-25 MED ORDER — LIDOCAINE HCL (PF) 2 % IJ SOLN: 10.0000 mL | Freq: Once | INTRAMUSCULAR | Status: AC

## 2021-04-25 MED ORDER — OXYCODONE-ACETAMINOPHEN 5-325 MG PO TABS: 1.0000 | ORAL_TABLET | Freq: Four times a day (QID) | ORAL | 0 refills | Status: AC | PRN

## 2021-04-25 MED ORDER — LIDOCAINE HCL (PF) 2 % IJ SOLN
INTRAMUSCULAR | Status: AC
  Administered 2021-04-25: 10 mL
  Filled 2021-04-25: qty 20

## 2021-04-25 MED ORDER — HYDROMORPHONE HCL 1 MG/ML IJ SOLN
1.0000 mg | Freq: Once | INTRAMUSCULAR | Status: AC
  Administered 2021-04-25: 1 mg via INTRAVENOUS
  Filled 2021-04-25: qty 1

## 2021-04-25 MED ORDER — BUPIVACAINE HCL (PF) 0.25 % IJ SOLN
10.0000 mL | Freq: Once | INTRAMUSCULAR | Status: AC
  Administered 2021-04-25: 10 mL
  Filled 2021-04-25: qty 30

## 2021-04-25 NOTE — ED Provider Notes (Addendum)
[facility] EMERGENCY DEPARTMENT Provider Note   CSN: [id number] Arrival date & time: 04/25/21  1711     History Chief Complaint  Patient presents with   Ankle Pain    Wrist pain     Catherine Costa is a 50 y.o. female.  Patient c/o trip and fall today. Was outside, at workplace, when tripped and fell onto outstretched left wrist. C/o left wrist and left ankle pain, acute onset, mod-severe, constant, dull, non radiating, worse w palpation. Skin intact to wrist and ankle, superficial abrasion to left lower leg. Tetanus updated 3 yrs ago per patient. No head injury or loc. No faintness or dizziness prior to or since fall. No headache. Was able to get up on own and ambulate post fall. Right hand dom. Denies neck or back pain, or other pain or injury. No numbness/weakness.   The history is provided by the patient.  Ankle Pain Associated symptoms: no back pain, no fever and no neck pain       Past Medical History:  Diagnosis Date   Anxiety    Asthma    Crohn's disease ([place])    Depression    Essential hypertension    GERD (gastroesophageal reflux disease)    History of pneumonia    History of pulmonary embolism 2017   Sleep apnea     Patient Active Problem List   Diagnosis Date Noted   Irritable bowel syndrome 04/11/2021   Colonic mass 04/11/2021   Abdominal pain, chronic, generalized 04/11/2021   Dysphagia 04/11/2021   Essential hypertension 10/02/2015   Diverticulitis of colon 10/02/2015   Suicidal thoughts 02/07/2012   Homeless 02/07/2012   Depression 02/07/2012    Past Surgical History:  Procedure Laterality Date   BIOPSY  11/09/2015   Procedure: BIOPSY;  Surgeon: Rogene Houston, MD;  Location: [facility];  Service: Endoscopy;;  polyp at ileocecal valve   COLONOSCOPY WITH PROPOFOL N/A 11/09/2015   Procedure: COLONOSCOPY WITH PROPOFOL;  Surgeon: Rogene Houston, MD;  Location: [facility];  Service: Endoscopy;  Laterality: N/A;  10:40   FRACTURE SURGERY  Left    ankle   POLYPECTOMY  11/09/2015   Procedure: POLYPECTOMY;  Surgeon: Rogene Houston, MD;  Location: [facility];  Service: Endoscopy;;  polypectomy @ ileocecal valve;   SEPTOPLASTY     TUBAL LIGATION     VAGINAL HYSTERECTOMY       OB History     Gravida  3   Para  3   Term  3   Preterm      AB      Living  3      SAB      IAB      Ectopic      Multiple      Live Births              Family History  Problem Relation Age of Onset   Heart disease Father    Arrhythmia Father    Hypertension Maternal Grandmother    Hypertension Maternal Grandfather    Heart attack Maternal Grandfather    Heart attack Paternal Grandmother    Heart disease Paternal Grandmother    Hypertension Paternal Grandmother    Hypertension Paternal Grandfather     Social History   Tobacco Use   Smoking status: Former    Packs/day: 0.50    Years: 25.00    Pack years: 12.50    Types: Cigarettes   Smokeless tobacco: Never  Vaping Use   Vaping Use: Never used  Substance Use Topics   Alcohol use: No   Drug use: No    Home Medications Prior to Admission medications   Medication Sig Start Date End Date Taking? Authorizing Provider  acetaminophen (TYLENOL) 500 MG tablet Take 1,000 mg by mouth every 6 (six) hours as needed for moderate pain.    [provider]  albuterol (PROAIR HFA) 108 (90 BASE) MCG/ACT inhaler Inhale 2 puffs into the lungs every 6 (six) hours as needed for wheezing or shortness of breath. 02/09/12   Darrol Jump, MD  citalopram (CELEXA) 40 MG tablet Take 40 mg by mouth daily.    [provider]  clonazePAM (KLONOPIN) 1 MG tablet Take 1 mg by mouth 3 (three) times daily as needed.     [provider]  HYDROcodone-acetaminophen (NORCO/VICODIN) 5-325 MG tablet Take 1 tablet by mouth every 6 (six) hours as needed. 04/08/21   [provider]  hyoscyamine (LEVSIN SL) 0.125 MG SL tablet Place 1 tablet (0.125 mg total) under  the tongue every 4 (four) hours as needed. 04/11/21   Harvel Quale, MD  naproxen sodium (ALEVE) 220 MG tablet Take 220 mg by mouth as needed. For stomach pain    [provider]  ondansetron (ZOFRAN) 4 MG tablet Take 1 tablet (4 mg total) by mouth every 8 (eight) hours as needed for nausea or vomiting. 12/05/19   Ezzard Standing, PA-C  pantoprazole (PROTONIX) 40 MG tablet Take 1 tablet (40 mg total) by mouth daily. Patient not taking: Reported on 04/11/2021 12/05/19   Ezzard Standing, PA-C    Allergies    Bee venom, Asa [aspirin], Sulfa antibiotics, and Voltaren [diclofenac sodium]  Review of Systems   Review of Systems  Constitutional:  Negative for fever.  HENT:  Negative for nosebleeds.   Eyes:  Negative for pain and visual disturbance.  Respiratory:  Negative for shortness of breath.   Cardiovascular:  Negative for chest pain.  Gastrointestinal:  Negative for abdominal pain, nausea and vomiting.  Genitourinary:  Negative for flank pain.  Musculoskeletal:  Negative for back pain and neck pain.  Skin:  Negative for rash.  Neurological:  Negative for weakness, numbness and headaches.  Hematological:  Does not bruise/bleed easily.       No anticoag use.   Psychiatric/Behavioral:  Negative for confusion.    Physical Exam Updated Vital Signs There were no vitals taken for this visit.  Physical Exam Vitals and nursing note reviewed.  Constitutional:      Appearance: Normal appearance. She is well-developed.  HENT:     Head: Atraumatic.     Nose: Nose normal.     Mouth/Throat:     Mouth: Mucous membranes are moist.  Eyes:     General: No scleral icterus.    Conjunctiva/sclera: Conjunctivae normal.     Pupils: Pupils are equal, round, and reactive to light.  Neck:     Trachea: No tracheal deviation.  Cardiovascular:     Rate and Rhythm: Normal rate and regular rhythm.     Pulses: Normal pulses.     Heart sounds: Normal heart sounds. No murmur heard.    No friction rub. No gallop.  Pulmonary:     Effort: Pulmonary effort is normal. No respiratory distress.     Breath sounds: Normal breath sounds.  Chest:     Chest wall: No tenderness.  Abdominal:     General: Bowel sounds are normal. There  is no distension.     Palpations: Abdomen is soft.     Tenderness: There is no abdominal tenderness. There is no guarding.  Genitourinary:    Comments: No cva tenderness.  Musculoskeletal:        General: No swelling.     Cervical back: Normal range of motion and neck supple. No rigidity or tenderness. No muscular tenderness.     Comments: CTLS spine, non tender, aligned, no step off. Tenderness/deformity left wrist, skin intact, radial pulse 2+, hand nvi. Tenderness left ankle laterally - ankle grossly stable, dp/pt palp, normal cap refill in toes, no 5th mt tenderness or other focal bony tenderness. Superficial abrasion to left anterior lower leg and knee, no fb seen or felt, no focal bony tenderness to area. Good rom bil extremities without other pain or focal bony tenderness.   Skin:    General: Skin is warm and dry.     Findings: No rash.  Neurological:     Mental Status: She is alert.     Comments: Alert, speech normal. Motor/sens grossly intact bil.   Psychiatric:        Mood and Affect: Mood normal.    ED Results / Procedures / Treatments   Labs (all labs ordered are listed, but only abnormal results are displayed) Labs Reviewed - No data to display  EKG None  Radiology DG Wrist Complete Left  Result Date: 04/25/2021 CLINICAL DATA:  Fall, left wrist pain EXAM: LEFT WRIST - COMPLETE 3+ VIEW COMPARISON:  None. FINDINGS: Impacted, displaced distal left radial fracture with posterior displacement and angulation of fracture fragments. Intra-articular extension. No subluxation or dislocation. IMPRESSION: Impacted, posteriorly displaced and angulated distal left radial fracture Electronically Signed   By: Rolm Baptise M.D.   On: 04/25/2021  18:40   DG Ankle Complete Left  Result Date: 04/25/2021 CLINICAL DATA:  Fall, pain EXAM: LEFT ANKLE COMPLETE - 3+ VIEW COMPARISON:  None. FINDINGS: Distal left fibular plate and screw fixation device related to remote injury. No acute fracture, subluxation or dislocation. Joint spaces maintained. Soft tissues are intact. IMPRESSION: No acute bony abnormality. Electronically Signed   By: Rolm Baptise M.D.   On: 04/25/2021 18:41    Procedures Procedures   Medications Ordered in ED Medications  ondansetron (ZOFRAN) injection 4 mg (has no administration in time range)  HYDROmorphone (DILAUDID) injection 1 mg (has no administration in time range)    ED Course  I have reviewed the triage vital signs and the nursing notes.  Pertinent labs & imaging results that were available during my care of the patient were reviewed by me and considered in my medical decision making (see chart for details).    MDM Rules/Calculators/A&P                           Imaging ordered. Dilaudid 1 mg iv. Zofran iv. Icepack to sore area, elevate, splint.   Xrays reviewed/interpreted by me - ankle neg. +distal radius fx.   Hematoma block, sterile prep, sterile technique, lid/bup.   Reduction of fx to improve alignment prior to splint. Alignment appears grossly improved from prior, however feel likely with need definitive reduction or operative repair - will have call ortho office in AM to arrange close f/u in the next couple days.   Right sugartong splint applied. Post splint, radial pulse 2+, normal cap refill in digits, hand nvi.   Pain improved on recheck.   Discussed w pt -  will need close ortho f/u, additional/definitive reduction vs orif.  Pt indicates has seen [facility] in [place] in past, and likely plans to go there. Will provide their info as well as local/on-call ortho if changes mind and wishes for closer option.      Final Clinical Impression(s) / ED Diagnoses Final diagnoses:  None    Rx /  DC Orders ED Discharge Orders     None          Lajean Saver, MD 04/25/21 2102

## 2021-04-25 NOTE — Discharge Instructions (Addendum)
It was our pleasure to provide your ER care today - we hope that you feel better.  Elevate wrist. Keep splint clean and dry. Icepack/cold to sore area.   Take motrin or aleve as need for pain. You may also take percocet as need for pain. No driving for the next 6 hours or when taking percocet. Also, do not take tylenol or acetaminophen containing medication when taking percocet.   You must follow up with orthopedist in the next few days (for definitive reduction or possibly surgery) - call office tomorrow AM to arrange appointment.   Return to ER if worse, new symptoms, numbness/weakness, intractable/severe pain, or other concern.   You were given pain meds in the ER - no driving for the next 8 hours.

## 2021-04-25 NOTE — ED Notes (Signed)
Dr. Ashok Cordia to bedside for wrist reduction.

## 2021-04-25 NOTE — ED Triage Notes (Signed)
Fell at work, pain in left wrist and left ankle

## 2021-04-25 NOTE — ED Notes (Signed)
Aircast applied to left ankle, +PMS distal to splint. L arm sugar tong splint applied, Dr. Ashok Cordia at bedside during application. +PMS in fingers, sling applied

## 2021-05-03 ENCOUNTER — Encounter (HOSPITAL_COMMUNITY): Admission: RE | Payer: Self-pay | Source: Ambulatory Visit

## 2021-05-03 ENCOUNTER — Ambulatory Visit (HOSPITAL_COMMUNITY): Admission: RE | Admit: 2021-05-03 | Payer: Self-pay | Source: Ambulatory Visit | Admitting: Gastroenterology

## 2021-05-03 SURGERY — COLONOSCOPY WITH PROPOFOL
Anesthesia: Monitor Anesthesia Care

## 2021-07-30 ENCOUNTER — Encounter (INDEPENDENT_AMBULATORY_CARE_PROVIDER_SITE_OTHER): Payer: Self-pay | Admitting: Internal Medicine

## 2021-07-30 ENCOUNTER — Ambulatory Visit (INDEPENDENT_AMBULATORY_CARE_PROVIDER_SITE_OTHER): Payer: Self-pay | Admitting: Internal Medicine

## 2021-10-15 ENCOUNTER — Encounter (INDEPENDENT_AMBULATORY_CARE_PROVIDER_SITE_OTHER): Payer: Self-pay | Admitting: *Deleted

## 2021-11-04 ENCOUNTER — Ambulatory Visit (INDEPENDENT_AMBULATORY_CARE_PROVIDER_SITE_OTHER): Payer: Self-pay | Admitting: Gastroenterology

## 2021-11-05 ENCOUNTER — Encounter (INDEPENDENT_AMBULATORY_CARE_PROVIDER_SITE_OTHER): Payer: Self-pay | Admitting: Gastroenterology

## 2021-11-05 ENCOUNTER — Encounter (INDEPENDENT_AMBULATORY_CARE_PROVIDER_SITE_OTHER): Payer: Self-pay | Admitting: *Deleted

## 2021-12-02 DIAGNOSIS — K5790 Diverticulosis of intestine, part unspecified, without perforation or abscess without bleeding: Secondary | ICD-10-CM | POA: Diagnosis not present

## 2021-12-02 DIAGNOSIS — Z20822 Contact with and (suspected) exposure to covid-19: Secondary | ICD-10-CM | POA: Diagnosis not present

## 2021-12-02 DIAGNOSIS — R197 Diarrhea, unspecified: Secondary | ICD-10-CM | POA: Diagnosis not present

## 2021-12-02 DIAGNOSIS — K5730 Diverticulosis of large intestine without perforation or abscess without bleeding: Secondary | ICD-10-CM | POA: Diagnosis not present

## 2021-12-02 DIAGNOSIS — R112 Nausea with vomiting, unspecified: Secondary | ICD-10-CM | POA: Diagnosis not present

## 2021-12-02 DIAGNOSIS — R300 Dysuria: Secondary | ICD-10-CM | POA: Diagnosis not present

## 2021-12-02 DIAGNOSIS — D3502 Benign neoplasm of left adrenal gland: Secondary | ICD-10-CM | POA: Diagnosis not present

## 2021-12-02 DIAGNOSIS — R1032 Left lower quadrant pain: Secondary | ICD-10-CM | POA: Diagnosis not present

## 2021-12-02 DIAGNOSIS — J45909 Unspecified asthma, uncomplicated: Secondary | ICD-10-CM | POA: Diagnosis not present

## 2021-12-02 DIAGNOSIS — R059 Cough, unspecified: Secondary | ICD-10-CM | POA: Diagnosis not present

## 2021-12-02 DIAGNOSIS — E860 Dehydration: Secondary | ICD-10-CM | POA: Diagnosis not present

## 2021-12-02 DIAGNOSIS — R748 Abnormal levels of other serum enzymes: Secondary | ICD-10-CM | POA: Diagnosis not present

## 2021-12-02 DIAGNOSIS — F17200 Nicotine dependence, unspecified, uncomplicated: Secondary | ICD-10-CM | POA: Diagnosis not present

## 2021-12-05 ENCOUNTER — Encounter (INDEPENDENT_AMBULATORY_CARE_PROVIDER_SITE_OTHER): Payer: Self-pay | Admitting: *Deleted

## 2022-01-14 ENCOUNTER — Ambulatory Visit (INDEPENDENT_AMBULATORY_CARE_PROVIDER_SITE_OTHER): Payer: Self-pay | Admitting: Gastroenterology

## 2022-01-15 ENCOUNTER — Encounter (INDEPENDENT_AMBULATORY_CARE_PROVIDER_SITE_OTHER): Payer: Self-pay | Admitting: *Deleted

## 2022-04-10 DIAGNOSIS — Z6826 Body mass index (BMI) 26.0-26.9, adult: Secondary | ICD-10-CM | POA: Diagnosis not present

## 2022-04-10 DIAGNOSIS — G5600 Carpal tunnel syndrome, unspecified upper limb: Secondary | ICD-10-CM | POA: Diagnosis not present

## 2022-04-10 DIAGNOSIS — R69 Illness, unspecified: Secondary | ICD-10-CM | POA: Diagnosis not present

## 2022-04-28 DIAGNOSIS — M7989 Other specified soft tissue disorders: Secondary | ICD-10-CM | POA: Diagnosis not present

## 2022-05-04 DIAGNOSIS — R112 Nausea with vomiting, unspecified: Secondary | ICD-10-CM | POA: Diagnosis not present

## 2022-05-04 DIAGNOSIS — R69 Illness, unspecified: Secondary | ICD-10-CM | POA: Diagnosis not present

## 2022-05-04 DIAGNOSIS — N23 Unspecified renal colic: Secondary | ICD-10-CM | POA: Diagnosis not present

## 2022-05-04 DIAGNOSIS — R319 Hematuria, unspecified: Secondary | ICD-10-CM | POA: Diagnosis not present

## 2022-05-04 DIAGNOSIS — R109 Unspecified abdominal pain: Secondary | ICD-10-CM | POA: Diagnosis not present

## 2022-05-04 DIAGNOSIS — N3001 Acute cystitis with hematuria: Secondary | ICD-10-CM | POA: Diagnosis not present

## 2022-05-04 DIAGNOSIS — R079 Chest pain, unspecified: Secondary | ICD-10-CM | POA: Diagnosis not present

## 2022-05-04 DIAGNOSIS — N134 Hydroureter: Secondary | ICD-10-CM | POA: Diagnosis not present

## 2022-05-04 DIAGNOSIS — N132 Hydronephrosis with renal and ureteral calculous obstruction: Secondary | ICD-10-CM | POA: Diagnosis not present

## 2022-10-14 DIAGNOSIS — F17210 Nicotine dependence, cigarettes, uncomplicated: Secondary | ICD-10-CM | POA: Diagnosis not present

## 2022-10-14 DIAGNOSIS — J0100 Acute maxillary sinusitis, unspecified: Secondary | ICD-10-CM | POA: Diagnosis not present

## 2022-10-14 DIAGNOSIS — R030 Elevated blood-pressure reading, without diagnosis of hypertension: Secondary | ICD-10-CM | POA: Diagnosis not present

## 2022-10-14 DIAGNOSIS — Z6825 Body mass index (BMI) 25.0-25.9, adult: Secondary | ICD-10-CM | POA: Diagnosis not present

## 2022-10-14 DIAGNOSIS — H6691 Otitis media, unspecified, right ear: Secondary | ICD-10-CM | POA: Diagnosis not present

## 2023-01-27 IMAGING — DX DG ANKLE COMPLETE 3+V*L*
3 series · 3 of 3 positions shown · non-contrast
Comparison: None.

CLINICAL DATA: Fall, pain

EXAM:
LEFT ANKLE COMPLETE - 3+ VIEW

[ankle ap]
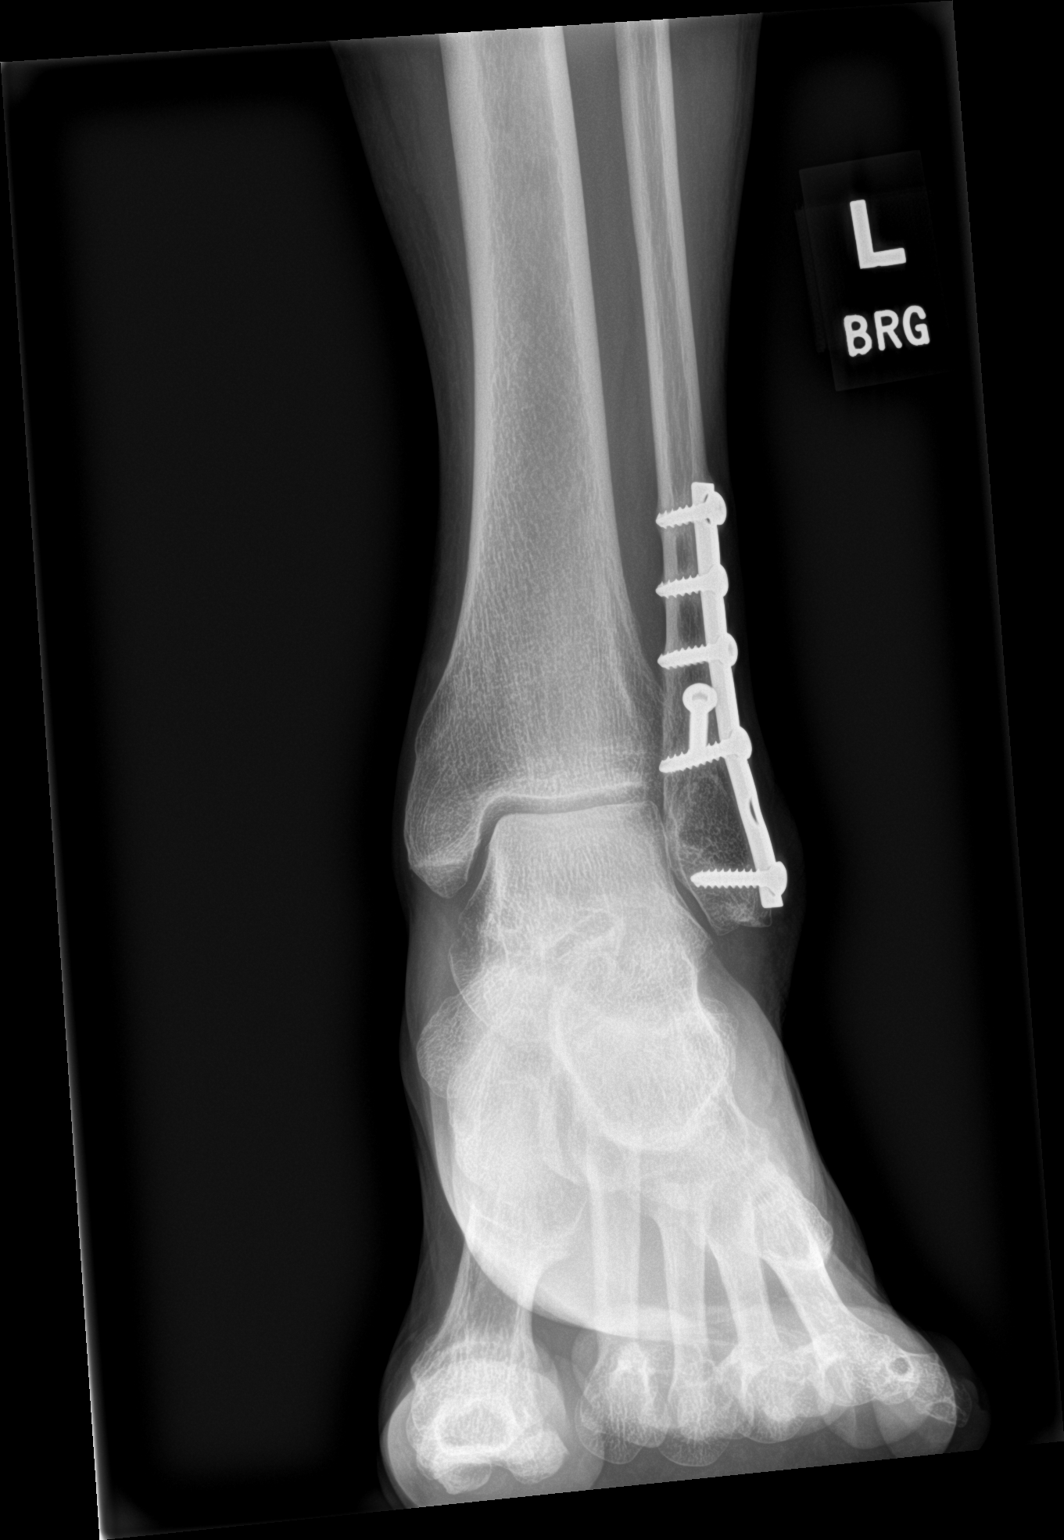

[ankle obl]
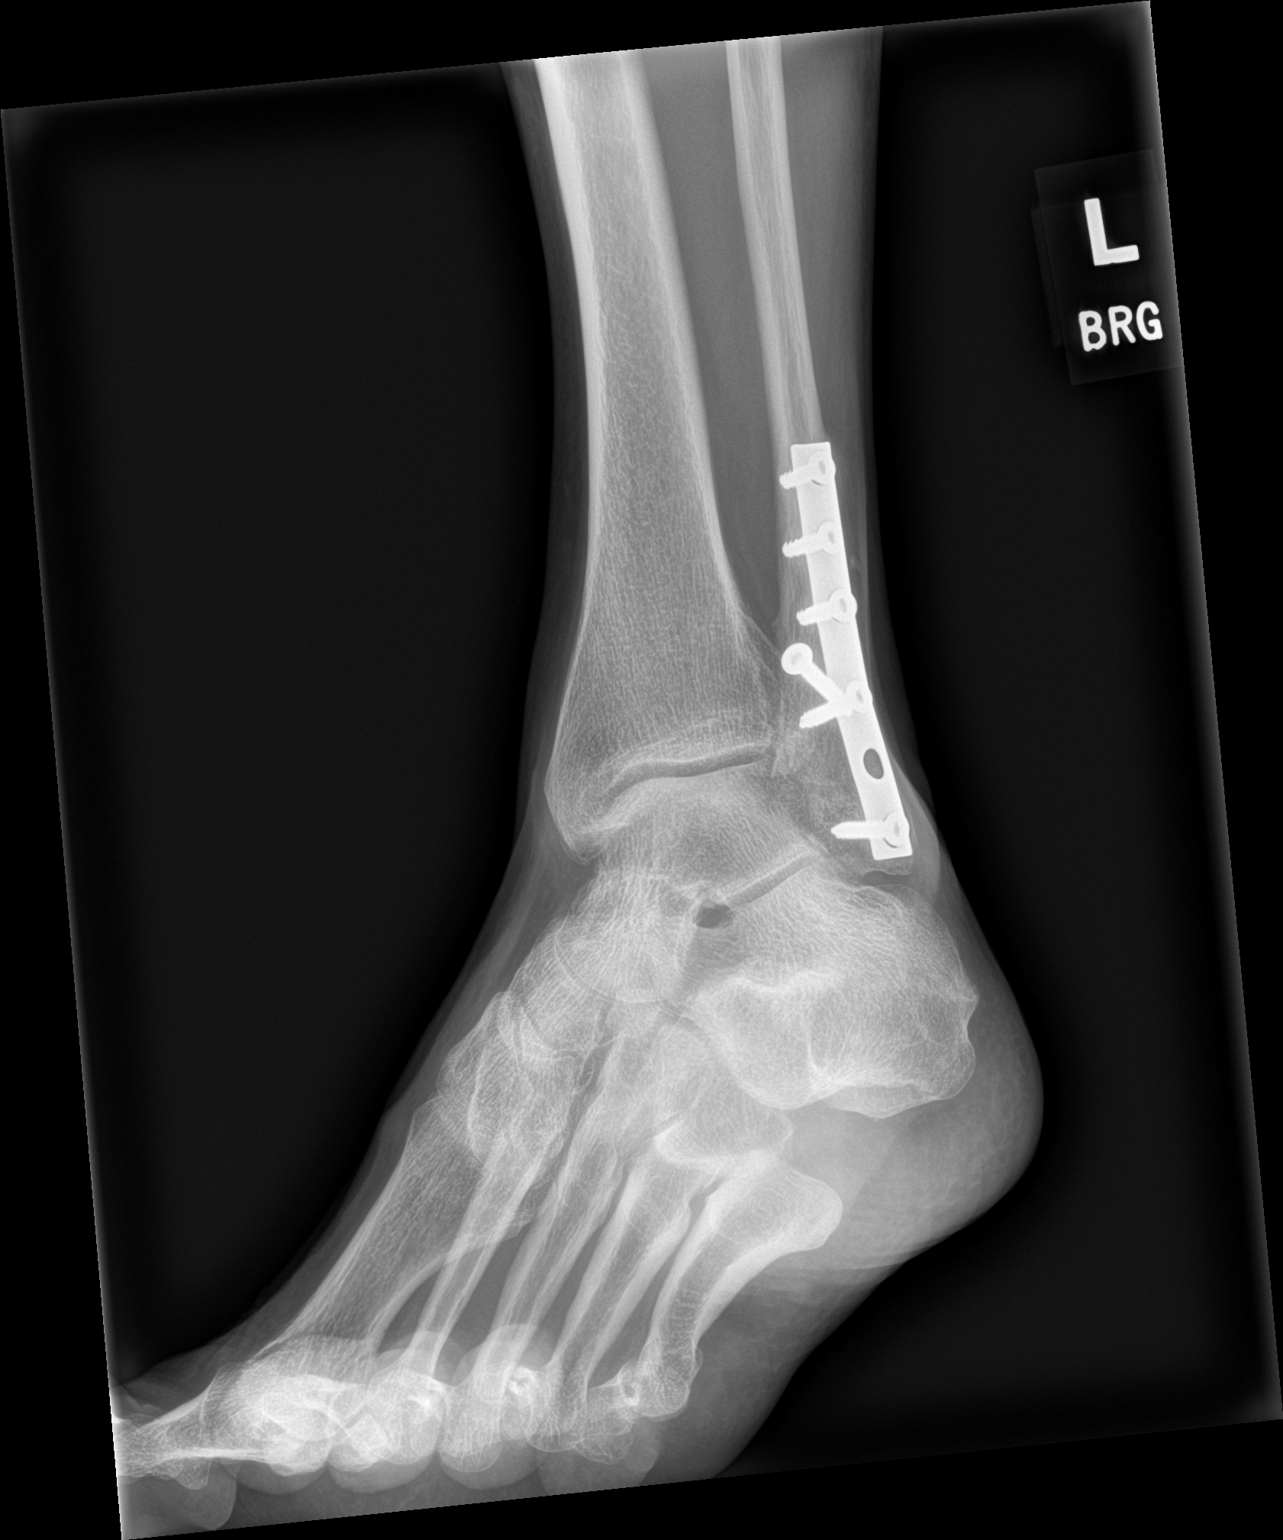

[ankle lat]
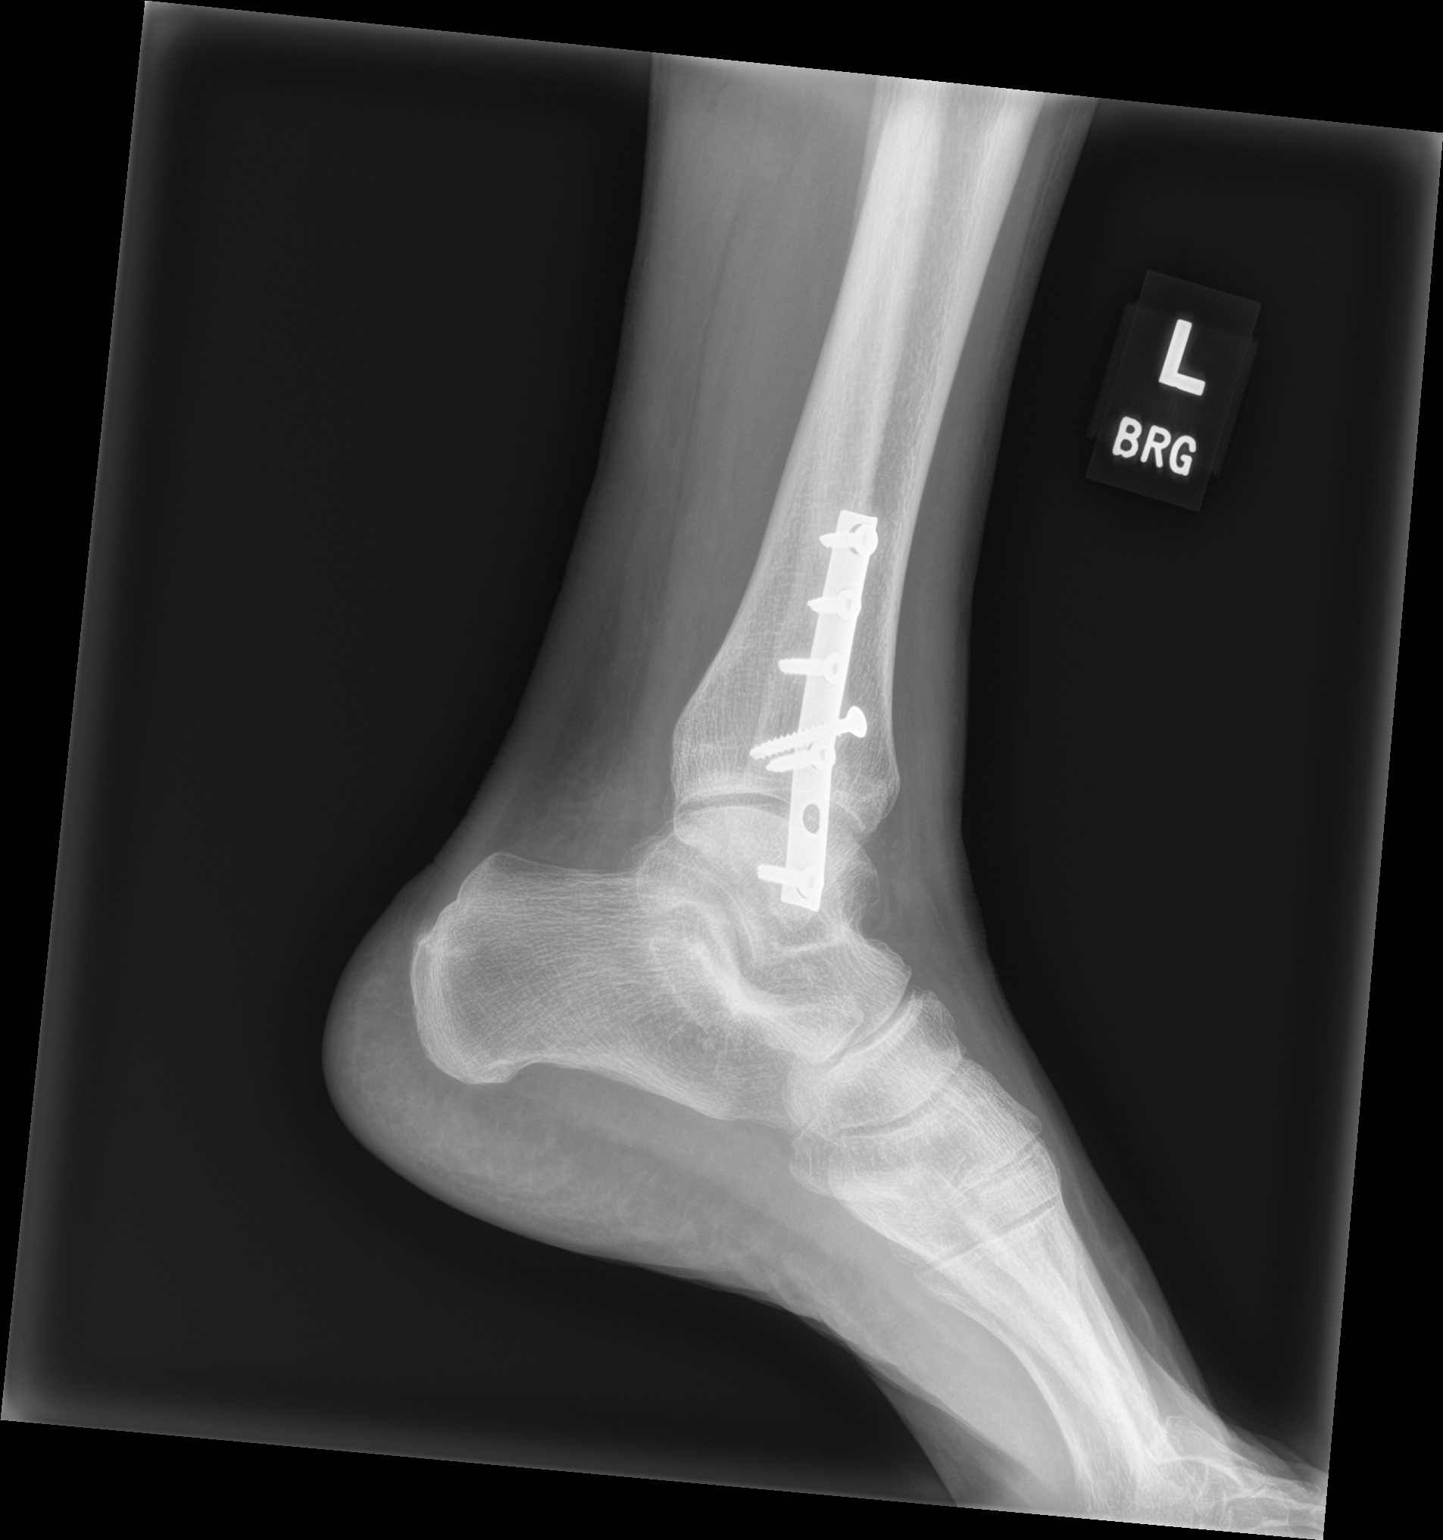

[3 of 3 positions shown; findings below may reference images not displayed]

FINDINGS: Distal left fibular plate and screw fixation device related to
remote injury. No acute fracture, subluxation or dislocation. Joint
spaces maintained. Soft tissues are intact.
IMPRESSION: No acute bony abnormality.

## 2023-01-27 IMAGING — DX DG WRIST COMPLETE 3+V*L*
4 series · 4 of 4 positions shown · non-contrast
Comparison: None.

CLINICAL DATA: Fall, left wrist pain

EXAM:
LEFT WRIST - COMPLETE 3+ VIEW

[wrist ap]
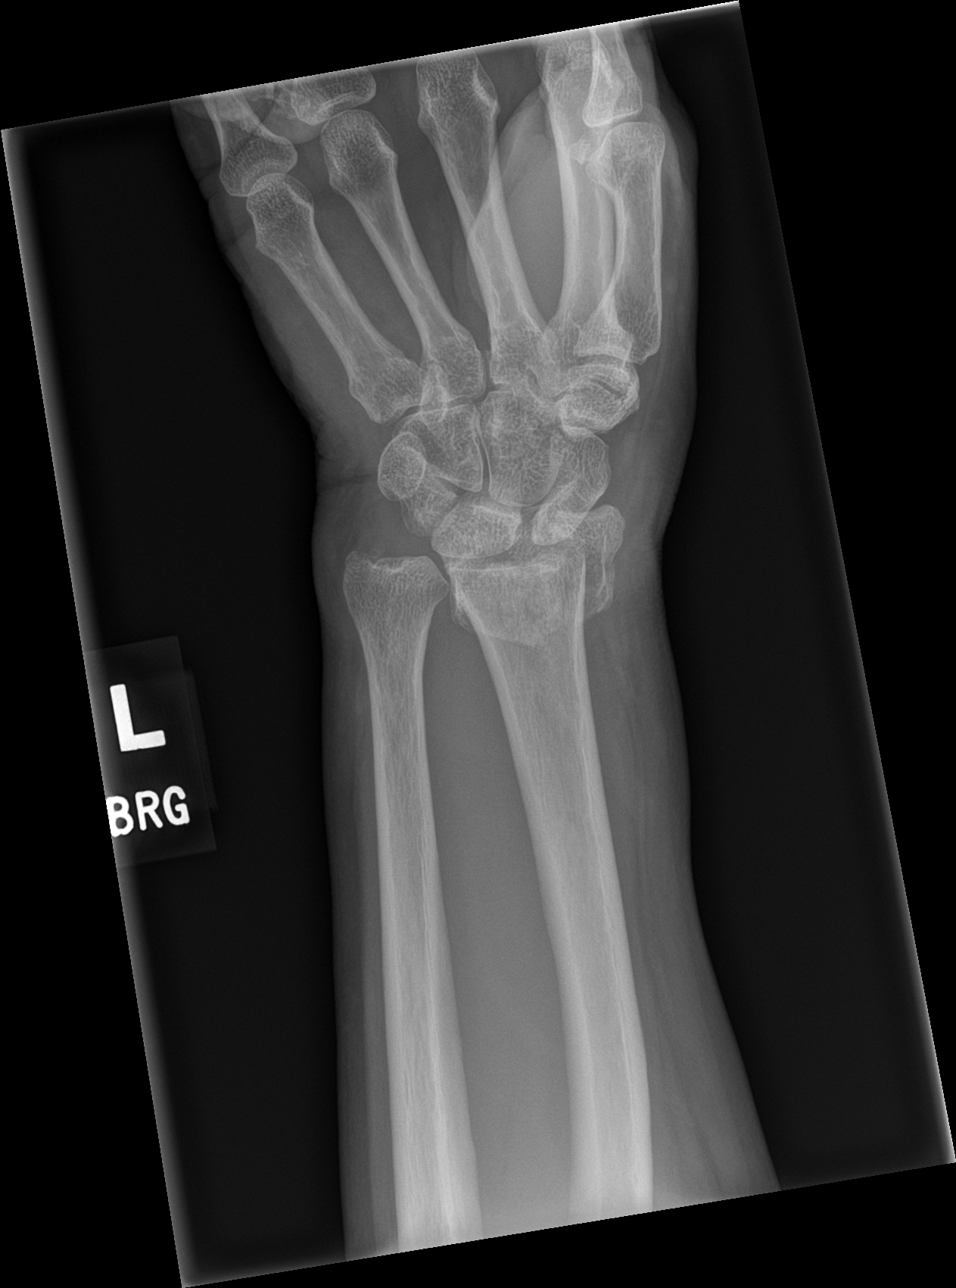

[wrist obl]
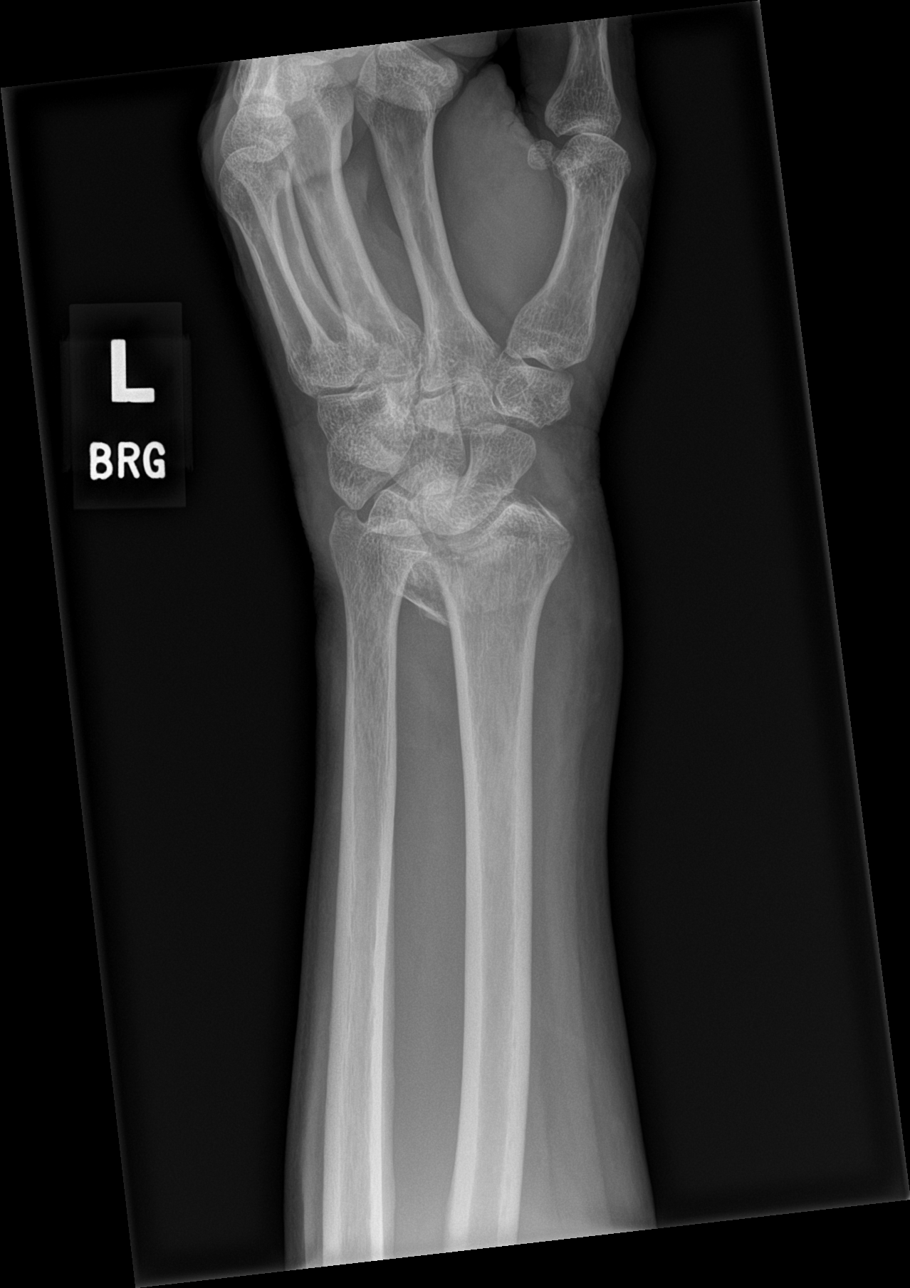

[wrist scaphoid]
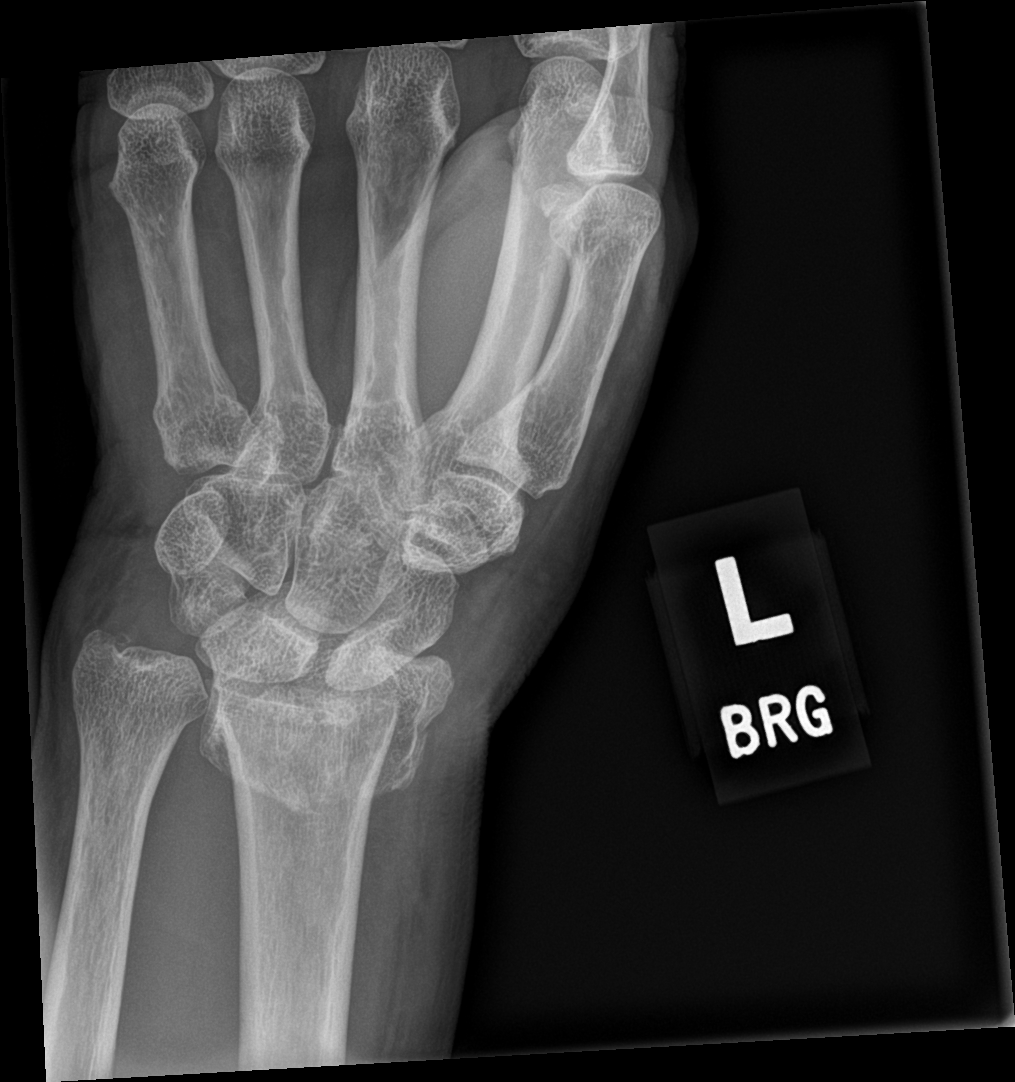

[wrist lat]
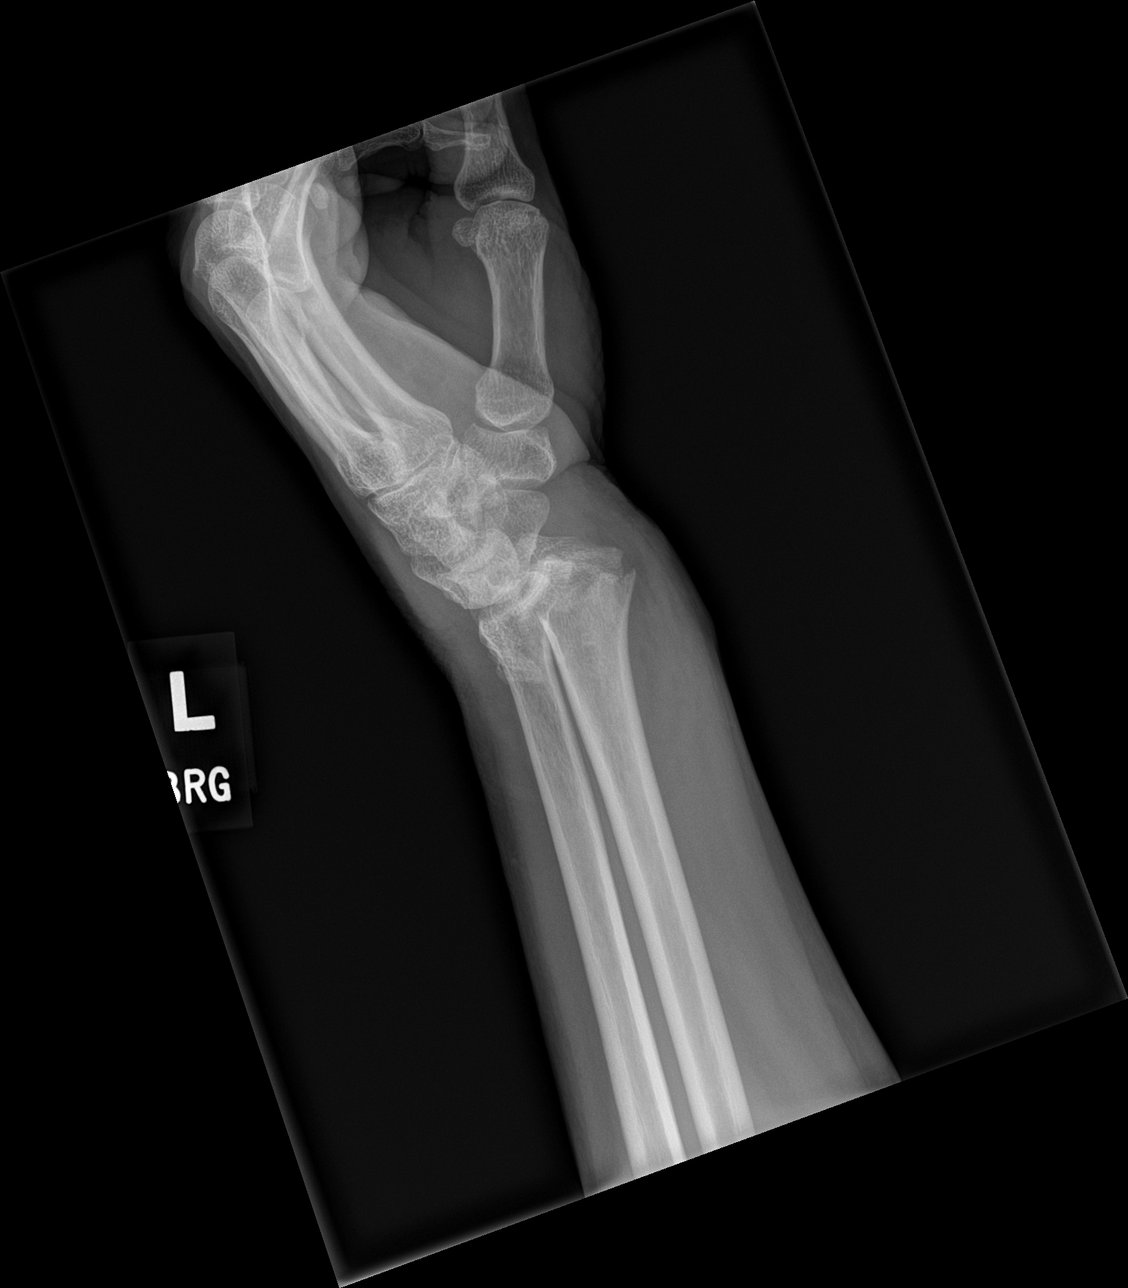

[4 of 4 positions shown; findings below may reference images not displayed]

FINDINGS: Impacted, displaced distal left radial fracture with posterior
displacement and angulation of fracture fragments. Intra-articular
extension. No subluxation or dislocation.
IMPRESSION: Impacted, posteriorly displaced and angulated distal left radial
fracture
